# Patient Record
Sex: Female | Born: 2000
Health system: Southern US, Community
[De-identification: ages and names within clinical notes are randomized; demographics above are authoritative.]

## PROBLEM LIST (undated history)

## (undated) DIAGNOSIS — J45909 Unspecified asthma, uncomplicated: Secondary | ICD-10-CM

## (undated) DIAGNOSIS — F32A Depression, unspecified: Secondary | ICD-10-CM

## (undated) DIAGNOSIS — F909 Attention-deficit hyperactivity disorder, unspecified type: Secondary | ICD-10-CM

## (undated) DIAGNOSIS — R7303 Prediabetes: Secondary | ICD-10-CM

## (undated) DIAGNOSIS — F419 Anxiety disorder, unspecified: Secondary | ICD-10-CM

## (undated) DIAGNOSIS — E559 Vitamin D deficiency, unspecified: Secondary | ICD-10-CM

## (undated) DIAGNOSIS — F988 Other specified behavioral and emotional disorders with onset usually occurring in childhood and adolescence: Secondary | ICD-10-CM

## (undated) HISTORY — DX: Depression, unspecified: F32.A

## (undated) HISTORY — DX: Vitamin D deficiency, unspecified: E55.9

## (undated) HISTORY — DX: Prediabetes: R73.03

## (undated) HISTORY — PX: TONSILLECTOMY: SUR1361

## (undated) HISTORY — DX: Other specified behavioral and emotional disorders with onset usually occurring in childhood and adolescence: F98.8

## (undated) HISTORY — DX: Anxiety disorder, unspecified: F41.9

---
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
## 2019-01-08 ENCOUNTER — Emergency Department (HOSPITAL_COMMUNITY)
Admission: EM | Admit: 2019-01-08 | Discharge: 2019-01-08 | Disposition: A | Discharge status: Home / Self Care | Payer: 59 | Attending: Emergency Medicine | Admitting: Emergency Medicine

## 2019-01-08 ENCOUNTER — Other Ambulatory Visit: Payer: Self-pay

## 2019-01-08 ENCOUNTER — Encounter (HOSPITAL_COMMUNITY): Payer: Self-pay | Admitting: Emergency Medicine

## 2019-01-08 ENCOUNTER — Emergency Department (HOSPITAL_COMMUNITY): Payer: 59

## 2019-01-08 DIAGNOSIS — R0602 Shortness of breath: Secondary | ICD-10-CM | POA: Diagnosis present

## 2019-01-08 DIAGNOSIS — R062 Wheezing: Secondary | ICD-10-CM | POA: Diagnosis not present

## 2019-01-08 DIAGNOSIS — Z79899 Other long term (current) drug therapy: Secondary | ICD-10-CM | POA: Diagnosis not present

## 2019-01-08 DIAGNOSIS — R05 Cough: Secondary | ICD-10-CM | POA: Diagnosis not present

## 2019-01-08 DIAGNOSIS — J45909 Unspecified asthma, uncomplicated: Secondary | ICD-10-CM | POA: Insufficient documentation

## 2019-01-08 DIAGNOSIS — J069 Acute upper respiratory infection, unspecified: Secondary | ICD-10-CM | POA: Diagnosis not present

## 2019-01-08 DIAGNOSIS — B9789 Other viral agents as the cause of diseases classified elsewhere: Secondary | ICD-10-CM | POA: Diagnosis not present

## 2019-01-08 LAB — GROUP A STREP BY PCR: Group A Strep by PCR: NOT DETECTED — AB

## 2019-01-08 MED ORDER — IBUPROFEN 600 MG PO TABS: 600.0000 mg | ORAL_TABLET | Freq: Four times a day (QID) | ORAL | 0 refills | Status: DC | PRN

## 2019-01-08 MED ORDER — KETOROLAC TROMETHAMINE 30 MG/ML IJ SOLN
30.0000 mg | Freq: Once | INTRAMUSCULAR | Status: DC
  Filled 2019-01-08: qty 1

## 2019-01-08 MED ORDER — ALBUTEROL SULFATE HFA 108 (90 BASE) MCG/ACT IN AERS
1.0000 | INHALATION_SPRAY | Freq: Once | RESPIRATORY_TRACT | Status: AC
  Administered 2019-01-08: 2 via RESPIRATORY_TRACT
  Filled 2019-01-08: qty 6.7

## 2019-01-08 MED ORDER — IPRATROPIUM-ALBUTEROL 0.5-2.5 (3) MG/3ML IN SOLN
3.0000 mL | Freq: Once | RESPIRATORY_TRACT | Status: DC
  Filled 2019-01-08: qty 3

## 2019-01-08 MED ORDER — AEROCHAMBER PLUS FLO-VU LARGE MISC
1.0000 | Freq: Once | Status: AC
  Administered 2019-01-08: 1

## 2019-01-08 MED ORDER — ALBUTEROL SULFATE (2.5 MG/3ML) 0.083% IN NEBU
5.0000 mg | INHALATION_SOLUTION | Freq: Once | RESPIRATORY_TRACT | Status: AC
  Administered 2019-01-08: 5 mg via RESPIRATORY_TRACT
  Filled 2019-01-08: qty 6

## 2019-01-08 MED FILL — IBUPROFEN 600 MG TABLET: 600 | 7 days supply | Qty: 30 | Fill #0

## 2019-01-08 NOTE — ED Provider Notes (Signed)
[facility] EMERGENCY DEPARTMENT Provider Note   CSN: [id number] Arrival date & time: 01/08/19  0108     History   Chief Complaint Chief Complaint  Patient presents with  . Cough  . Nasal Congestion  . Shortness of Breath    HPI Michelle Blevins is a 18 y.o. female.  Pt presents to the ED today with sob, nasal congestion, sore throat, and cough.  Pt has been sick since 1/23.  She has been exposed to family members and school friends who have been sick.  Pt has a hx of asthma and does not have an inhaler.  She developed wheezing and had a hard time breathing. This prompted her to come to the ED.  No fevers.     Past Medical History:  Diagnosis Date  . ADHD (attention deficit hyperactivity disorder)   . Asthma     Patient Active Problem List   Diagnosis Date Noted  . ADHD (attention deficit hyperactivity disorder), 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  . 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 TONSILLECTOMY       OB History   No obstetric history on file.      Home Medications    Prior to Admission medications   Medication Sig Start Date End Date Taking? Authorizing Provider  Multiple Vitamin (MULTIVITAMIN WITH MINERALS) TABS tablet Take 1 tablet by mouth daily.   Yes [provider]  VYVANSE 40 MG capsule Take 1 capsule (40 mg total) by mouth every morning. Patient taking differently: Take 40 mg by mouth daily. Take with Vyvanse 50 mg to equal 90 mg 11/08/18  Yes Paretta-Leahey, Dawn M, NP  VYVANSE 50 MG capsule Take 1 capsule (50 mg total) by mouth every morning. Patient taking differently: Take 50 mg by mouth daily. Take with vyvanse 40 mg to equal 90 mg 11/08/18  Yes Paretta-Leahey, Miachel Roux, NP  ibuprofen (ADVIL,MOTRIN) 600 MG tablet Take 1 tablet (600 mg total) by mouth every 6 (six) hours as needed. 01/08/19   Jacalyn Lefevre, MD    Family History Family History  Problem Relation Age of Onset  . Breast  cancer Mother     Social History Social History   Tobacco Use  . Smoking status: Never Smoker  . Smokeless tobacco: Never Used  Substance Use Topics  . Alcohol use: No  . Drug use: No     Allergies   Epiduo [adapalene-benzoyl peroxide]   Review of Systems Review of Systems  HENT: Positive for congestion, rhinorrhea and sore throat.   Respiratory: Positive for cough and wheezing.   All other systems reviewed and are negative.    Physical Exam Updated Vital Signs BP 104/65 (BP Location: Right Arm)   Pulse 67   Temp 98 F (36.7 C) (Oral)   Resp 18   LMP 12/30/2018   SpO2 98%   Physical Exam Vitals signs and nursing note reviewed.  Constitutional:      Appearance: She is well-developed and normal weight.  HENT:     Head: Normocephalic and atraumatic.     Mouth/Throat:     Mouth: Mucous membranes are moist.     Pharynx: Pharyngeal swelling and oropharyngeal exudate present.  Eyes:     Extraocular Movements: Extraocular movements intact.     Pupils: Pupils are equal, round, and reactive to light.  Neck:     Musculoskeletal: Normal range of motion and neck supple.  Cardiovascular:     Rate and Rhythm:  Normal rate and regular rhythm.  Pulmonary:     Effort: Pulmonary effort is normal.     Breath sounds: Wheezing present.  Abdominal:     General: Bowel sounds are normal.     Palpations: Abdomen is soft.  Musculoskeletal: Normal range of motion.  Skin:    General: Skin is warm and dry.     Capillary Refill: Capillary refill takes less than 2 seconds.  Neurological:     General: No focal deficit present.     Mental Status: She is alert and oriented to person, place, and time.  Psychiatric:        Mood and Affect: Mood normal.        Behavior: Behavior normal.      ED Treatments / Results  Labs (all labs ordered are listed, but only abnormal results are displayed) Labs Reviewed  GROUP A STREP BY PCR - Abnormal; Notable for the following components:       Result Value   Group A Strep by PCR NONE DETECTED (*)    All other components within normal limits    EKG None  Radiology Dg Chest 2 View  Result Date: 01/08/2019 CLINICAL DATA:  Cough and congestion for 2 days EXAM: CHEST - 2 VIEW COMPARISON:  None. FINDINGS: Prominent markings at the lung bases on the frontal view but no focal airspace disease on the lateral view. There is no edema, consolidation, effusion, or pneumothorax. Normal heart size and mediastinal contours. IMPRESSION: Negative chest. Electronically Signed   By: Marnee Spring M.D.   On: 01/08/2019 04:37    Procedures Procedures (including critical care time)  Medications Ordered in ED Medications  ketorolac (TORADOL) 30 MG/ML injection 30 mg (has no administration in time range)  AEROCHAMBER PLUS FLO-VU LARGE MISC 1 each (has no administration in time range)  albuterol (PROVENTIL) (2.5 MG/3ML) 0.083% nebulizer solution 5 mg (5 mg Nebulization Given 01/08/19 0118)  albuterol (PROVENTIL HFA;VENTOLIN HFA) 108 (90 Base) MCG/ACT inhaler 1-2 puff (2 puffs Inhalation Given 01/08/19 0543)     Initial Impression / Assessment and Plan / ED Course  I have reviewed the triage vital signs and the nursing notes.  Pertinent labs & imaging results that were available during my care of the patient were reviewed by me and considered in my medical decision making (see chart for details).     Strep negative.  She likely has the flu, but she's been sick for too many days and she is not a high risk patient.  Pt given an albuterol inhaler plus spacer.  She is instructed to return if worse.  F/u with pcp.  Final Clinical Impressions(s) / ED Diagnoses   Final diagnoses:  Viral upper respiratory tract infection    ED Discharge Orders         Ordered    ibuprofen (ADVIL,MOTRIN) 600 MG tablet  Every 6 hours PRN     01/08/19 4332           Jacalyn Lefevre, MD 01/08/19 [phone]

## 2019-01-08 NOTE — ED Triage Notes (Addendum)
Reports sore throat since Thursday, productive cough with white sputum, and nasal congestion.  Denies body aches and fever.  Also reports SOB that started tonight.  Audible wheezing.

## 2019-01-10 ENCOUNTER — Ambulatory Visit: Payer: 59 | Admitting: Family Medicine

## 2019-01-10 ENCOUNTER — Encounter: Payer: Self-pay | Admitting: Family Medicine

## 2019-01-10 VITALS — BP 110/82 | HR 118 | Temp 98.4°F | Wt 212.0 lb

## 2019-01-10 DIAGNOSIS — J4531 Mild persistent asthma with (acute) exacerbation: Secondary | ICD-10-CM

## 2019-01-10 DIAGNOSIS — B9789 Other viral agents as the cause of diseases classified elsewhere: Secondary | ICD-10-CM

## 2019-01-10 DIAGNOSIS — J069 Acute upper respiratory infection, unspecified: Secondary | ICD-10-CM

## 2019-01-10 NOTE — Progress Notes (Signed)
Subjective:    Patient ID: Michelle Blevins, female    DOB: Feb 11, 2001, 18 y.o.   MRN: [id number]  No chief complaint on file.   HPI Patient was seen today for ED follow-up.  Pt seen on1/27/2020 for viral upper respiratory infection told likely influenza but not tested for as >48hrs with symptoms.  Pt tested for Strep, which was negative.   Pt here today for f/u.  Endorses scratchy throat, rhinorrhea, nasal congestion, coughing, decreased hearing.  Pt denies fever, chills, HA, N/V.  Sick contacts included her classmates.  Pt has taken cough medicine and albuterol inhaler.  Pt unsure how often she is using the inhaler.  Has a h/o asthma (though never mentioned before). Notes increased symptoms with being sick.  Past Medical History:  Diagnosis Date  . ADHD (attention deficit hyperactivity disorder)   . Asthma     Allergies  Allergen Reactions  . Epiduo [Adapalene-Benzoyl Peroxide] Swelling    Pt had swelling in the face from medication    ROS General: Denies fever, chills, night sweats, changes in weight, changes in appetite HEENT: Denies headaches, ear pain, changes in vision, rhinorrhea, sore throat  +decreased hearing, rhinorrhea, scratchy throat, nasal congestion CV: Denies CP, palpitations, SOB, orthopnea Pulm: Denies SOB  +cough, wheezing GI: Denies abdominal pain, nausea, vomiting, diarrhea, constipation GU: Denies dysuria, hematuria, frequency, vaginal discharge Msk: Denies muscle cramps, joint pains Neuro: Denies weakness, numbness, tingling Skin: Denies rashes, bruising Psych: Denies depression, anxiety, hallucinations    Objective:    Blood pressure 110/82, pulse (!) 118, temperature 98.4 F (36.9 C), temperature source Oral, weight 212 lb (96.2 kg), last menstrual period 12/30/2018, SpO2 96 %.  Gen. Pleasant, well-nourished, in no distress, normal affect   HEENT: [place]/AT, face symmetric, no scleral icterus, PERRLA, nares patent with clear drainage, pharynx with mild  erythema and post nasal drainage, no exudate.  TMs full.  No cervical lymphadenopathy Lungs: coughing, no accessory muscle use, diffuse wheezing throughout Cardiovascular: tachycardia, no m/r/g, no peripheral edema Neuro:  A&Ox3, CN II-XII intact, normal gait  Wt Readings from Last 3 Encounters:  01/10/19 212 lb (96.2 kg) (98 %, Z= 2.12)*  10/05/17 202 lb 6.4 oz (91.8 kg) (98 %, Z= 2.05)*  10/19/15 188 lb (85.3 kg) (98 %, Z= 2.03)*   * Growth percentiles are based on CDC (Girls, 2-20 Years) data.    Lab Results  Component Value Date   WBC 9.7 02/27/2009   HGB 12.1 02/27/2009   HCT 35.9 02/27/2009   PLT 419 (H) 02/27/2009   INR 1.0 02/27/2009    Assessment/Plan:  Viral upper respiratory tract infection with cough -discussed supportive treatment -encouraged to stay hydrated. -given handout  Mild persistent asthma with acute exacerbation -Duo neb given in clinic 2/2 wheezing -continue albuterol inhaler q 6hrs prn -Given RTC or ED precautions  F/u prn  Abbe Amsterdam, MD

## 2019-01-10 NOTE — Patient Instructions (Signed)
Upper Respiratory Infection, Adult An upper respiratory infection (URI) is a common viral infection of the nose, throat, and upper air passages that lead to the lungs. The most common type of URI is the common cold. URIs usually get better on their own, without medical treatment. What are the causes? A URI is caused by a virus. You may catch a virus by:  Breathing in droplets from an infected person's cough or sneeze.  Touching something that has been exposed to the virus (contaminated) and then touching your mouth, nose, or eyes. What increases the risk? You are more likely to get a URI if:  You are very young or very old.  It is autumn or winter.  You have close contact with others, such as at a daycare, school, or health care facility.  You smoke.  You have long-term (chronic) heart or lung disease.  You have a weakened disease-fighting (immune) system.  You have nasal allergies or asthma.  You are experiencing a lot of stress.  You work in an area that has poor air circulation.  You have poor nutrition. What are the signs or symptoms? A URI usually involves some of the following symptoms:  Runny or stuffy (congested) nose.  Sneezing.  Cough.  Sore throat.  Headache.  Fatigue.  Fever.  Loss of appetite.  Pain in your forehead, behind your eyes, and over your cheekbones (sinus pain).  Muscle aches.  Redness or irritation of the eyes.  Pressure in the ears or face. How is this diagnosed? This condition may be diagnosed based on your medical history and symptoms, and a physical exam. Your health care provider may use a cotton swab to take a mucus sample from your nose (nasal swab). This sample can be tested to determine what virus is causing the illness. How is this treated? URIs usually get better on their own within 7-10 days. You can take steps at home to relieve your symptoms. Medicines cannot cure URIs, but your health care provider may recommend  certain medicines to help relieve symptoms, such as:  Over-the-counter cold medicines.  Cough suppressants. Coughing is a type of defense against infection that helps to clear the respiratory system, so take these medicines only as recommended by your health care provider.  Fever-reducing medicines. Follow these instructions at home: Activity  Rest as needed.  If you have a fever, stay home from work or school until your fever is gone or until your health care provider says you are no longer contagious. Your health care provider may have you wear a face mask to prevent your infection from spreading. Relieving symptoms  Gargle with a salt-water mixture 3-4 times a day or as needed. To make a salt-water mixture, completely dissolve -1 tsp of salt in 1 cup of warm water.  Use a cool-mist humidifier to add moisture to the air. This can help you breathe more easily. Eating and drinking   Drink enough fluid to keep your urine pale yellow.  Eat soups and other clear broths. General instructions   Take over-the-counter and prescription medicines only as told by your health care provider. These include cold medicines, fever reducers, and cough suppressants.  Do not use any products that contain nicotine or tobacco, such as cigarettes and e-cigarettes. If you need help quitting, ask your health care provider.  Stay away from secondhand smoke.  Stay up to date on all immunizations, including the yearly (annual) flu vaccine.  Keep all follow-up visits as told by your health   care provider. This is important. How to prevent the spread of infection to others   URIs can be passed from person to person (are contagious). To prevent the infection from spreading: ? Wash your hands often with soap and water. If soap and water are not available, use hand sanitizer. ? Avoid touching your mouth, face, eyes, or nose. ? Cough or sneeze into a tissue or your sleeve or elbow instead of into your hand  or into the air. Contact a health care provider if:  You are getting worse instead of better.  You have a fever or chills.  Your mucus is brown or red.  You have yellow or brown discharge coming from your nose.  You have pain in your face, especially when you bend forward.  You have swollen neck glands.  You have pain while swallowing.  You have white areas in the back of your throat. Get help right away if:  You have shortness of breath that gets worse.  You have severe or persistent: ? Headache. ? Ear pain. ? Sinus pain. ? Chest pain.  You have chronic lung disease along with any of the following: ? Wheezing. ? Prolonged cough. ? Coughing up blood. ? A change in your usual mucus.  You have a stiff neck.  You have changes in your: ? Vision. ? Hearing. ? Thinking. ? Mood. Summary  An upper respiratory infection (URI) is a common infection of the nose, throat, and upper air passages that lead to the lungs.  A URI is caused by a virus.  URIs usually get better on their own within 7-10 days.  Medicines cannot cure URIs, but your health care provider may recommend certain medicines to help relieve symptoms. This information is not intended to replace advice given to you by your health care provider. Make sure you discuss any questions you have with your health care provider. Document Released: 05/25/2001 Document Revised: 07/15/2017 Document Reviewed: 07/15/2017 Elsevier Interactive Patient Education  2019 [employer] Inc[employer]    

## 2019-01-12 ENCOUNTER — Encounter (HOSPITAL_COMMUNITY): Payer: Self-pay | Admitting: Emergency Medicine

## 2019-01-12 ENCOUNTER — Ambulatory Visit (HOSPITAL_COMMUNITY)
Admission: EM | Admit: 2019-01-12 | Discharge: 2019-01-12 | Disposition: A | Discharge status: Home / Self Care | Payer: 59 | Attending: Family Medicine | Admitting: Family Medicine

## 2019-01-12 ENCOUNTER — Other Ambulatory Visit: Payer: Self-pay

## 2019-01-12 DIAGNOSIS — J069 Acute upper respiratory infection, unspecified: Secondary | ICD-10-CM

## 2019-01-12 DIAGNOSIS — G510 Bell's palsy: Secondary | ICD-10-CM

## 2019-01-12 MED ORDER — POLYMYXIN B-TRIMETHOPRIM 10000-0.1 UNIT/ML-% OP SOLN: 1.0000 [drp] | OPHTHALMIC | 0 refills | Status: DC

## 2019-01-12 MED ORDER — VALACYCLOVIR HCL 1 G PO TABS: 1000.0000 mg | ORAL_TABLET | Freq: Three times a day (TID) | ORAL | 0 refills | Status: DC

## 2019-01-12 MED ORDER — PREDNISONE 10 MG (21) PO TBPK: ORAL_TABLET | ORAL | 0 refills | Status: DC

## 2019-01-12 NOTE — Discharge Instructions (Signed)
We are treating for Bell's palsy today Valacyclovir 3 times a day for the next 7 days Prednisone taper over the next 6 days Polytrim eyedrops for the left eye.  1 drop in the left eye every 4 hours while awake Start taking some Mucinex for cough, congestion and to thin mucus Ibuprofen as needed for pain Follow up as needed for continued or worsening symptoms

## 2019-01-12 NOTE — ED Triage Notes (Signed)
PT reports left sided facial numbness and droop that started yesterday. PT's smile is assymetrical. Watery eye on left side. PT reports no numbness or loss of sensation in left arm or leg. Has had a cold recently.

## 2019-01-13 ENCOUNTER — Encounter: Payer: Self-pay | Admitting: Family Medicine

## 2019-01-13 ENCOUNTER — Ambulatory Visit: Payer: 59 | Admitting: Adult Health

## 2019-01-13 DIAGNOSIS — J4531 Mild persistent asthma with (acute) exacerbation: Secondary | ICD-10-CM | POA: Insufficient documentation

## 2019-01-14 NOTE — ED Provider Notes (Signed)
[facility]    CSN: [id number] Arrival date & time: 01/12/19  1854     History   Chief Complaint Chief Complaint  Patient presents with  . Facial Droop    HPI Michelle Blevins is a 18 y.o. female.   Pt is a 18 year old female that presents with URI symptoms, left facial problem. She noticed the facial paralysis on Thursday. She is having facial droop, trouble closing the left eye and tearing from the left eye. Left side of face "feels funny". She was seen in the ER on 01/08/19 and dx with URI and given duo neb and sent home with inhaler for wheezing. Her URI symptoms have worsened. Her cough and congestion is worse. No fever, myalgias, sore throat or ear pain. Mom has been giving her mucinex. No vision changes, headache, dizziness.   ROS per HPI      Past Medical History:  Diagnosis Date  . ADHD (attention deficit hyperactivity disorder)   . Asthma     Patient Active Problem List   Diagnosis Date Noted  . Mild persistent asthma with acute exacerbation 01/13/2019  . ADHD (attention deficit hyperactivity disorder), 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  . 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 TONSILLECTOMY      OB History   No obstetric history on file.      Home Medications    Prior to Admission medications   Medication Sig Start Date End Date Taking? Authorizing Provider  Multiple Vitamin (MULTIVITAMIN WITH MINERALS) TABS tablet Take 1 tablet by mouth daily.   Yes [provider]  VYVANSE 40 MG capsule Take 1 capsule (40 mg total) by mouth every morning. Patient taking differently: Take 40 mg by mouth daily. Take with Vyvanse 50 mg to equal 90 mg 11/08/18  Yes Paretta-Leahey, Dawn M, NP  VYVANSE 50 MG capsule Take 1 capsule (50 mg total) by mouth every morning. Patient taking differently: Take 50 mg by mouth daily. Take with vyvanse 40 mg to equal 90 mg 11/08/18  Yes Paretta-Leahey, Miachel Roux, NP  ibuprofen  (ADVIL,MOTRIN) 600 MG tablet Take 1 tablet (600 mg total) by mouth every 6 (six) hours as needed. 01/08/19   Jacalyn Lefevre, MD  predniSONE (STERAPRED UNI-PAK 21 TAB) 10 MG (21) TBPK tablet 6 tabs for 1 day, then 5 tabs for 1 das, then 4 tabs for 1 day, then 3 tabs for 1 day, 2 tabs for 1 day, then 1 tab for 1 day 01/12/19   Dahlia Byes A, NP  trimethoprim-polymyxin b (POLYTRIM) ophthalmic solution Place 1 drop into the left eye every 4 (four) hours. 01/12/19   Dahlia Byes A, NP  valACYclovir (VALTREX) 1000 MG tablet Take 1 tablet (1,000 mg total) by mouth 3 (three) times daily. 01/12/19   Janace Aris, NP    Family History Family History  Problem Relation Age of Onset  . Breast cancer Mother     Social History Social History   Tobacco Use  . Smoking status: Never Smoker  . Smokeless tobacco: Never Used  Substance Use Topics  . Alcohol use: No  . Drug use: No     Allergies   Epiduo [adapalene-benzoyl peroxide]   Review of Systems Review of Systems   Physical Exam Triage Vital Signs ED Triage Vitals  Enc Vitals Group     BP 01/12/19 1920 118/72     Pulse Rate 01/12/19 1920 (!) 112     Resp 01/12/19 1920  16     Temp 01/12/19 1920 98.5 F (36.9 C)     Temp Source 01/12/19 1920 Oral     SpO2 01/12/19 1920 100 %     Weight --      Height --      Head Circumference --      Peak Flow --      Pain Score 01/12/19 1919 0     Pain Loc --      Pain Edu? --      Excl. in GC? --    No data found.  Updated Vital Signs BP 118/72   Pulse (!) 112   Temp 98.5 F (36.9 C) (Oral)   Resp 16   LMP 12/30/2018   SpO2 100%   Visual Acuity Right Eye Distance: 20/20 Left Eye Distance: 20/40 Bilateral Distance: 20/20  Right Eye Near:   Left Eye Near:    Bilateral Near:     Physical Exam Vitals signs and nursing note reviewed.  Constitutional:      General: She is not in acute distress.    Appearance: She is well-developed. She is ill-appearing. She is not toxic-appearing.   HENT:     Head: Normocephalic and atraumatic.     Right Ear: Tympanic membrane and ear canal normal.     Left Ear: Tympanic membrane and ear canal normal.     Nose: Congestion and rhinorrhea present.     Mouth/Throat:     Pharynx: Oropharynx is clear.  Eyes:     General:        Left eye: Discharge present.    Conjunctiva/sclera: Conjunctivae normal.     Comments: Left scleral injection with mucous.   Neck:     Musculoskeletal: Neck supple. No neck rigidity.  Cardiovascular:     Rate and Rhythm: Normal rate and regular rhythm.     Heart sounds: No murmur.  Pulmonary:     Effort: Pulmonary effort is normal. No respiratory distress.     Breath sounds: Wheezing present.     Comments: Harsh cough Musculoskeletal: Normal range of motion.  Skin:    General: Skin is warm and dry.     Findings: No rash.  Neurological:     Mental Status: She is alert.     Cranial Nerves: Cranial nerve deficit present.     Comments: Left facial paralysis.  Unable to raise the left eyebrow and positive left facial droop with smiling.  She is able to close the left eye with force.  Mild sensory deficit to the left facial area.   No other deficits.   Psychiatric:        Mood and Affect: Mood normal.      UC Treatments / Results  Labs (all labs ordered are listed, but only abnormal results are displayed) Labs Reviewed - No data to display  EKG None  Radiology No results found.  Procedures Procedures (including critical care time)  Medications Ordered in UC Medications - No data to display  Initial Impression / Assessment and Plan / UC Course  I have reviewed the triage vital signs and the nursing notes.  Pertinent labs & imaging results that were available during my care of the patient were reviewed by me and considered in my medical decision making (see chart for details).     Symptoms consistent with Bells Palsy I believe that she also has severe upper  respiratory infection with  viral conjunctivitis.  Some wheezing present and harsh cough.  will go ahead and treat with valacyclovir and prednisone Giving Polytrim for the left eye.  mucinex for cough and congestion albuterol for cough, wheezing SOB.  Follow up as needed for continued or worsening symptoms      Final Clinical Impressions(s) / UC Diagnoses   Final diagnoses:  Bell's palsy  Acute upper respiratory infection     Discharge Instructions     We are treating for Bell's palsy today Valacyclovir 3 times a day for the next 7 days Prednisone taper over the next 6 days Polytrim eyedrops for the left eye.  1 drop in the left eye every 4 hours while awake Start taking some Mucinex for cough, congestion and to thin mucus Ibuprofen as needed for pain Follow up as needed for continued or worsening symptoms      ED Prescriptions    Medication Sig Dispense Auth. Provider   valACYclovir (VALTREX) 1000 MG tablet Take 1 tablet (1,000 mg total) by mouth 3 (three) times daily. 21 tablet Krishav Mamone A, NP   predniSONE (STERAPRED UNI-PAK 21 TAB) 10 MG (21) TBPK tablet 6 tabs for 1 day, then 5 tabs for 1 das, then 4 tabs for 1 day, then 3 tabs for 1 day, 2 tabs for 1 day, then 1 tab for 1 day 21 tablet Neta Upadhyay A, NP   trimethoprim-polymyxin b (POLYTRIM) ophthalmic solution Place 1 drop into the left eye every 4 (four) hours. 10 mL Dahlia ByesBast, Lyndol Vanderheiden A, NP     Controlled Substance Prescriptions [place] Controlled Substance Registry consulted? no   Janace ArisBast, Aliviana Burdell A, NP 01/14/19 1351

## 2019-03-16 ENCOUNTER — Other Ambulatory Visit: Payer: Self-pay

## 2019-03-16 ENCOUNTER — Encounter: Payer: Self-pay | Admitting: Family

## 2019-03-16 ENCOUNTER — Ambulatory Visit (INDEPENDENT_AMBULATORY_CARE_PROVIDER_SITE_OTHER): Payer: 59 | Admitting: Family

## 2019-03-16 DIAGNOSIS — F902 Attention-deficit hyperactivity disorder, combined type: Secondary | ICD-10-CM | POA: Diagnosis not present

## 2019-03-16 DIAGNOSIS — Z7189 Other specified counseling: Secondary | ICD-10-CM | POA: Diagnosis not present

## 2019-03-16 DIAGNOSIS — R278 Other lack of coordination: Secondary | ICD-10-CM

## 2019-03-16 DIAGNOSIS — F819 Developmental disorder of scholastic skills, unspecified: Secondary | ICD-10-CM | POA: Diagnosis not present

## 2019-03-16 DIAGNOSIS — Z79899 Other long term (current) drug therapy: Secondary | ICD-10-CM

## 2019-03-16 DIAGNOSIS — Z719 Counseling, unspecified: Secondary | ICD-10-CM

## 2019-03-16 DIAGNOSIS — Z8709 Personal history of other diseases of the respiratory system: Secondary | ICD-10-CM | POA: Diagnosis not present

## 2019-03-16 MED ORDER — VYVANSE 50 MG PO CAPS: 50.0000 mg | ORAL_CAPSULE | Freq: Every day | ORAL | 0 refills | Status: DC

## 2019-03-16 MED ORDER — VYVANSE 40 MG PO CAPS: 40.0000 mg | ORAL_CAPSULE | ORAL | 0 refills | Status: DC

## 2019-03-16 MED FILL — VYVANSE 50 MG CAPSULE: 50 | 30 days supply | Qty: 30 | Fill #0

## 2019-03-16 MED FILL — VYVANSE 40 MG CAPSULE: 40 | 30 days supply | Qty: 30 | Fill #0

## 2019-03-16 NOTE — Progress Notes (Signed)
Patient ID: Michelle Blevins, female   DOB: 2001/01/21, 18 y.o.   MRN: [id number]  [facility] [address], [place]. [address] [place] [address] Dept: [phone] Dept Fax: [phone]  Medication Check visit via Virtual Video due to COVID-19  Patient ID:  Michelle Blevins  female DOB: [birth year]   18 y.o.   MRN: [id number]   DATE:03/16/19  PCP: Deeann Saint, MD  Virtual Visit via Video Note  I connected [phone] 's self (Name Michelle Blevins) on 03/16/19 at  2:00 PM EDT by a video enabled telemedicine application and verified that I am speaking with the correct person using two identifiers.   I discussed the limitations of evaluation and management by telemedicine and the availability of in person appointments. The patient/parent expressed understanding and agreed to proceed.  Parent Location: at home  Provider Locations: [address] [place], [place]  HISTORY/CURRENT STATUS: Michelle Blevins is here for medication management of the psychoactive medications for ADHD and review of educational and behavioral concerns.  Michelle Blevins currently taking Vyvanse 50 mg and 40 mg each daily, which is working well. Takes medication at 8:00 am. Medication tends to wear off around 5:00 pm. Michelle Blevins is able to focus through school work.   Michelle Blevins is eating well (eating breakfast, lunch and dinner).  No recent changes reported by patient, the same.  Sleeping well (goes to bed at 11:00 pm wakes at 7:00 am), sleeping through the night. No changes recently.  Michelle Blevins denies thoughts of hurting self or others, denies depression, anxiety, or fears.   EDUCATION: School: [employer] Year/Grade: 12th grade  Performance/ Grades: above average Services: IEP/504 Plan Work to complete outside of the class meeting time and it takes only 30 mins or less.   To attend [employer] next year and looking at orientation online instead of in  person. To attend summer program is still is allowed by the school.   Michelle Blevins is currently out of school due to social distancing due to COVID-19 Attending online classes daily and not sure what school will do for graduation. Online graduation for schooling if continues with COVID-19 restrictions.   Activities/ Exercise: intermittently-walking the dog and exercise on the piliates machine  Screen time: (phone, tablet, TV, computer): No changes with the amount of time she is on a screen. Enjoying more outside time and drawing.   MEDICAL HISTORY: Individual Medical History/ Review of Systems: Changes? :Yes URI in January and seen in the ED, repeated ED visit for Bell's Palsy.   Family Medical/ Social History: Changes? None   Current Medications:  Outpatient Encounter Medications as of 03/16/2019  Medication Sig  . loratadine (CLARITIN) 10 MG tablet Take 10 mg by mouth daily.  . Multiple Vitamin (MULTIVITAMIN WITH MINERALS) TABS tablet Take 1 tablet by mouth daily.  Marland Kitchen VYVANSE 40 MG capsule Take 1 capsule (40 mg total) by mouth every morning.  Marland Kitchen VYVANSE 50 MG capsule Take 1 capsule (50 mg total) by mouth daily. Take with vyvanse 40 mg to equal 90 mg  . [DISCONTINUED] VYVANSE 40 MG capsule Take 1 capsule (40 mg total) by mouth every morning.  . [DISCONTINUED] VYVANSE 50 MG capsule Take 1 capsule (50 mg total) by mouth every morning. (Patient taking differently: Take 50 mg by mouth daily. Take with vyvanse 40 mg to equal 90 mg)  . ibuprofen (ADVIL,MOTRIN) 600 MG tablet Take 1 tablet (600 mg total) by mouth every 6 (six) hours as  needed. (Patient not taking: Reported on 03/16/2019)  . [DISCONTINUED] predniSONE (STERAPRED UNI-PAK 21 TAB) 10 MG (21) TBPK tablet 6 tabs for 1 day, then 5 tabs for 1 das, then 4 tabs for 1 day, then 3 tabs for 1 day, 2 tabs for 1 day, then 1 tab for 1 day (Patient not taking: Reported on 03/16/2019)  . [DISCONTINUED] trimethoprim-polymyxin b (POLYTRIM) ophthalmic solution Place 1  drop into the left eye every 4 (four) hours. (Patient not taking: Reported on 03/16/2019)  . [DISCONTINUED] valACYclovir (VALTREX) 1000 MG tablet Take 1 tablet (1,000 mg total) by mouth 3 (three) times daily. (Patient not taking: Reported on 03/16/2019)   No facility-administered encounter medications on file as of 03/16/2019.    Medication Side Effects: None  MENTAL HEALTH: Mental Health Issues:   None reported by patient today.   DIAGNOSES:    ICD-10-CM   1. ADHD (attention deficit hyperactivity disorder), combined type F90.2 VYVANSE 40 MG capsule    VYVANSE 50 MG capsule  2. Dysgraphia R27.8   3. Medication management Z79.899   4. Learning difficulty F81.9   5. Patient counseled Z71.9   6. Goals of care, counseling/discussion Z71.89   7. History of asthma Z87.09     RECOMMENDATIONS:  Discussed recent history and updates received by patient since last f/u visit.  Discussed school academic progress and appropriate accommodations as needed for learning problems school.   Discussed continued need for routine, structure, motivation, reward and positive reinforcement with continued learning success.   Encouraged recommended limitations on TV, tablets, phones, video games and computers for non-educational activities.   Encouraged physical activity and outdoor play, maintaining social distancing.   Referred to [employer] for resources about engaging children who are at home in home and online study.    Counseled medication pharmacokinetics, options, dosage, administration, desired effects, and possible side effects.   Vyvanse 50 mg and 40 mg each Rx # 30 with no RF's.   I discussed the assessment and treatment plan with the patient/parent. The patient/parent was provided an opportunity to ask questions and all were answered. The patient/ parent agreed with the plan and demonstrated an understanding of the instructions.   I provided 30 minutes of non-face-to-face time during this  encounter. Record review of 10 minutes prior to the virtual video visit.   NEXT APPOINTMENT:  Return in about 3 months (around 06/15/2019) for follow up visit.  The patient/parent was advised to call back or seek an in-person evaluation if the symptoms worsen or if the condition fails to improve as anticipated.  Medical Decision-making: More than 50% of the appointment was spent counseling and discussing diagnosis and management of symptoms with the patient and family.  Carron Curie, NP

## 2019-05-28 ENCOUNTER — Ambulatory Visit (INDEPENDENT_AMBULATORY_CARE_PROVIDER_SITE_OTHER): Payer: 59 | Admitting: Family

## 2019-05-28 ENCOUNTER — Encounter: Payer: Self-pay | Admitting: Family

## 2019-05-28 DIAGNOSIS — F819 Developmental disorder of scholastic skills, unspecified: Secondary | ICD-10-CM

## 2019-05-28 DIAGNOSIS — F902 Attention-deficit hyperactivity disorder, combined type: Secondary | ICD-10-CM | POA: Diagnosis not present

## 2019-05-28 DIAGNOSIS — R278 Other lack of coordination: Secondary | ICD-10-CM

## 2019-05-28 DIAGNOSIS — J4531 Mild persistent asthma with (acute) exacerbation: Secondary | ICD-10-CM

## 2019-05-28 DIAGNOSIS — Z79899 Other long term (current) drug therapy: Secondary | ICD-10-CM

## 2019-05-28 MED ORDER — VYVANSE 50 MG PO CAPS: 50.0000 mg | ORAL_CAPSULE | Freq: Every day | ORAL | 0 refills | Status: DC

## 2019-05-28 MED ORDER — VYVANSE 40 MG PO CAPS: 40.0000 mg | ORAL_CAPSULE | ORAL | 0 refills | Status: DC

## 2019-05-28 MED FILL — VYVANSE 50 MG CAPSULE: 50 | 30 days supply | Qty: 30 | Fill #0

## 2019-05-28 MED FILL — VYVANSE 40 MG CAPSULE: 40 | 30 days supply | Qty: 30 | Fill #0

## 2019-05-28 NOTE — Progress Notes (Signed)
[facility] [address] [place] [address] Dept: [phone] Dept Fax: [phone]  Medication Check visit via Virtual Video due to COVID-19  Patient ID:  Michelle SaucierRachel Blevins  female DOB: 10/04/2001   18 y.o.   MRN: [id number]   DATE:05/28/19  PCP: Deeann SaintBanks, Shannon R, MD  Virtual Visit via Video Note  I connected with  Michelle Saucierachel Blevins  and Michelle Saucierachel Blevins 's Mother (Name [name]) on 05/28/19 at  1:00 PM EDT by a video enabled telemedicine application and verified that I am speaking with the correct person using two identifiers. Patient & Parent Location: at home.   I discussed the limitations, risks, security and privacy concerns of performing an evaluation and management service by telephone and the availability of in person appointments. I also discussed with the parents that there may be a patient responsible charge related to this service. The parents expressed understanding and agreed to proceed.  Provider: Carron Curieawn M Paretta-Leahey, NP  Location: work location  HISTORY/CURRENT STATUS: Michelle SaucierRachel Blevins is here for medication management of the psychoactive medications for ADHD and review of educational and behavioral concerns.   Michelle Blevins currently taking Vyvanse, which is working well. Takes medication in the morning. Medication tends to wear off around 6-7:00 pm. Michelle Blevins is able to focus through school/homework.   Michelle Blevins is eating well (eating breakfast, lunch and dinner). No recent issues reported by patient.  Sleeping well (getting enough sleep), sleeping through the night.   EDUCATION: School: To attend [employer] Year/Grade: freshman  Performance/ Grades: above average Services: IEP/504 Plan and Other: disabiltiy services on campus  Michelle Blevins was out of school due to social distancing due to COVID-19 and participated in a home schooling program. TO attend summer transitioning program.   Activities/ Exercise:  intermittently-outside  Screen time: (phone, tablet, TV, computer): TV, computer, movies and phone.   MEDICAL HISTORY: Individual Medical History/ Review of Systems: Changes? :None recently reported.   Family Medical/ Social History: Changes? No Patient Lives with: mother  Current Medications:  Outpatient Encounter Medications as of 05/28/2019  Medication Sig  . ibuprofen (ADVIL,MOTRIN) 600 MG tablet Take 1 tablet (600 mg total) by mouth every 6 (six) hours as needed.  . Multiple Vitamin (MULTIVITAMIN WITH MINERALS) TABS tablet Take 1 tablet by mouth daily.  Marland Kitchen. VYVANSE 40 MG capsule Take 1 capsule (40 mg total) by mouth every morning.  Marland Kitchen. VYVANSE 50 MG capsule Take 1 capsule (50 mg total) by mouth daily. Take with vyvanse 40 mg to equal 90 mg  . [DISCONTINUED] VYVANSE 40 MG capsule Take 1 capsule (40 mg total) by mouth every morning.  . [DISCONTINUED] VYVANSE 50 MG capsule Take 1 capsule (50 mg total) by mouth daily. Take with vyvanse 40 mg to equal 90 mg  . loratadine (CLARITIN) 10 MG tablet Take 10 mg by mouth daily.   No facility-administered encounter medications on file as of 05/28/2019.    Medication Side Effects: None  MENTAL HEALTH: Mental Health Issues:   None recently reported    DIAGNOSES:    ICD-10-CM   1. ADHD (attention deficit hyperactivity disorder), combined type  F90.2 VYVANSE 40 MG capsule    VYVANSE 50 MG capsule  2. Dysgraphia  R27.8   3. Learning difficulty  F81.9   4. Mild persistent asthma with acute exacerbation  J45.31   5. patient counseled  Z79.899   6. Medication management  Z79.899     RECOMMENDATIONS:  Discussed recent history with  patient & parent with learning and health since last f/u visit.   Discussed school academic progress and recommended continued summer academic home school activities using appropriate accommodations for school.   Discussed continued need for routine, structure, motivation, reward and positive reinforcement with school.   Encouraged recommended limitations on TV, tablets, phones, video games and computers for non-educational activities.   Discussed need for bedtime routine, use of good sleep hygiene, no video games, TV or phones for an hour before bedtime.   Encouraged physical activity and outdoor play, maintaining social distancing.   Counseled medication pharmacokinetics, options, dosage, administration, desired effects, and possible side effects.   Vyvanse 50 mg and 40 mg each daily, # 30 with no RF's for each Rx. RX for above e-scribed and sent to pharmacy on record  [place], [place] - [place] [address] Phone: [phone] Fax: [phone]  I discussed the assessment and treatment plan with the patient & parent. The patient & parent was provided an opportunity to ask questions and all were answered. The patient & parent agreed with the plan and demonstrated an understanding of the instructions.   I provided 25 minutes of non-face-to-face time during this encounter. Completed record review for 10 minutes prior to the virtual video visit.   NEXT APPOINTMENT:  Return in about 3 months (around 08/28/2019) for follow up visit.  The patient was advised to call back or seek an in-person evaluation if the symptoms worsen or if the condition fails to improve as anticipated.  Medical Decision-making: More than 50% of the appointment was spent counseling and discussing diagnosis and management of symptoms with the patient and family.  Carolann Littler, NP

## 2019-07-09 ENCOUNTER — Telehealth: Payer: Self-pay

## 2019-07-09 NOTE — Telephone Encounter (Signed)
Verified home address with patient  

## 2019-09-14 ENCOUNTER — Other Ambulatory Visit: Payer: Self-pay

## 2019-09-14 ENCOUNTER — Encounter: Payer: 59 | Admitting: Family

## 2019-09-14 NOTE — Progress Notes (Deleted)
[facility]. [address] [place] [address] Dept: [phone] Dept Fax: [phone]  Medication Check visit via Virtual Video due to COVID-19  Patient ID:  Michelle Blevins  female DOB: [birth year]   18 y.o.   MRN: [id number]   DATE:09/14/19  PCP: Billie Ruddy, MD  Virtual Visit via Video Note  I connected with  Lawanna Kobus on 09/14/19 at 10:00 AM EDT by a video enabled telemedicine application and verified that I am speaking with the correct person using two identifiers. Patient/Parent Location: school    I discussed the limitations, risks, security and privacy concerns of performing an evaluation and management service by telephone and the availability of in person appointments. I also discussed with the parents that there may be a patient responsible charge related to this service. The parents expressed understanding and agreed to proceed.  Provider: Carolann Littler, NP  Location: private location  HISTORY/CURRENT STATUS: Michelle Blevins is here for medication management of the psychoactive medications for ADHD and review of educational and behavioral concerns.   Michelle Blevins currently taking ***  which is working well. Takes medication at *** am. Medication tends to wear off around ***. Michelle Blevins is able to focus through homework.   Michelle Blevins is eating well (eating breakfast, lunch and dinner).   Sleeping well (goes to bed at *** pm wakes at *** am), sleeping through the night.   EDUCATION: School: [employer]  Year/Grade: Freshman  Performance/ Grades:  Services: help as needed.   Michelle Blevins is currently in distance learning due to social distancing due to COVID-19 and will continue for at least: the fall semester  Activities/ Exercise: {desc; exercise peds:19433}  Screen time: (phone, tablet, TV, computer): ***  MEDICAL HISTORY: Individual Medical History/ Review of Systems:  Changes? :{EXAM; YES/NO:19492::"No"}  Family Medical/ Social History: Changes? {EXAM; YES/NO:19492::"No"} Patient Lives with: {CHL AMB LIVING TZGY:1749449675}  Current Medications:  Current Outpatient Medications on File Prior to Visit  Medication Sig Dispense Refill  . ibuprofen (ADVIL,MOTRIN) 600 MG tablet Take 1 tablet (600 mg total) by mouth every 6 (six) hours as needed. 30 tablet 0  . loratadine (CLARITIN) 10 MG tablet Take 10 mg by mouth daily.    . Multiple Vitamin (MULTIVITAMIN WITH MINERALS) TABS tablet Take 1 tablet by mouth daily.    Marland Kitchen VYVANSE 40 MG capsule Take 1 capsule (40 mg total) by mouth every morning. 30 capsule 0  . VYVANSE 50 MG capsule Take 1 capsule (50 mg total) by mouth daily. Take with vyvanse 40 mg to equal 90 mg 30 capsule 0   No current facility-administered medications on file prior to visit.     Medication Side Effects: {Medication Side Effects (Optional):21014029}  MENTAL HEALTH: Mental Health Issues:   {Mental Health Problems (Optional):21014030}    DIAGNOSES:  No diagnosis found.  RECOMMENDATIONS:  Discussed recent history with patient/parent  Discussed school academic progress and recommended continued accommodations for the new school year.  Referred to ADDitudemag.com for resources about using distance learning with children with ADHD  Children and young adults with ADHD often suffer from disorganization, difficulty with time management, completing projects and other executive function difficulties.  Recommended Reading: "Smart but Scattered" and "Smart but Scattered Teens" by Peg Renato Battles and Ethelene Browns.    Discussed continued need for structure, routine, reward (external), motivation (internal), positive reinforcement, consequences, and organization  Encouraged recommended limitations on TV, tablets, phones, video games and computers for non-educational  activities.   Discussed need for bedtime routine, use of good sleep hygiene, no video  games, TV or phones for an hour before bedtime.   Encouraged physical activity and outdoor play, maintaining social distancing.   Counseled medication pharmacokinetics, options, dosage, administration, desired effects, and possible side effects.   ***    I discussed the assessment and treatment plan with the patient/parent. The patient/parent was provided an opportunity to ask questions and all were answered. The patient/ parent agreed with the plan and demonstrated an understanding of the instructions.   I provided *** minutes of non-face-to-face time during this encounter.   Completed record review for *** minutes prior to the virtual *** visit.   NEXT APPOINTMENT:  No follow-ups on file.  The patient/parent was advised to call back or seek an in-person evaluation if the symptoms worsen or if the condition fails to improve as anticipated.  Medical Decision-making: More than 50% of the appointment was spent counseling and discussing diagnosis and management of symptoms with the patient and family.  Carron Curie, NP

## 2019-09-18 ENCOUNTER — Encounter: Payer: Self-pay | Admitting: Family

## 2019-09-18 NOTE — Progress Notes (Signed)
A user error has taken place: encounter opened in error, closed for administrative reasons.    This encounter was created in error - please disregard.  This encounter was created in error - please disregard.

## 2019-09-19 ENCOUNTER — Other Ambulatory Visit: Payer: Self-pay

## 2019-09-19 ENCOUNTER — Encounter: Payer: 59 | Admitting: Family

## 2019-09-20 ENCOUNTER — Telehealth: Payer: Self-pay | Admitting: Family

## 2019-09-20 NOTE — Telephone Encounter (Signed)
Provider reports that she tried to reach patient numerous times for her telehealth appointment with no success.

## 2019-09-27 ENCOUNTER — Encounter: Payer: Self-pay | Admitting: Family

## 2019-09-27 ENCOUNTER — Ambulatory Visit (INDEPENDENT_AMBULATORY_CARE_PROVIDER_SITE_OTHER): Payer: 59 | Admitting: Family

## 2019-09-27 DIAGNOSIS — Z558 Other problems related to education and literacy: Secondary | ICD-10-CM | POA: Diagnosis not present

## 2019-09-27 DIAGNOSIS — F902 Attention-deficit hyperactivity disorder, combined type: Secondary | ICD-10-CM | POA: Diagnosis not present

## 2019-09-27 DIAGNOSIS — J4531 Mild persistent asthma with (acute) exacerbation: Secondary | ICD-10-CM

## 2019-09-27 DIAGNOSIS — Z7189 Other specified counseling: Secondary | ICD-10-CM | POA: Diagnosis not present

## 2019-09-27 DIAGNOSIS — F819 Developmental disorder of scholastic skills, unspecified: Secondary | ICD-10-CM | POA: Diagnosis not present

## 2019-09-27 DIAGNOSIS — R278 Other lack of coordination: Secondary | ICD-10-CM | POA: Diagnosis not present

## 2019-09-27 NOTE — Progress Notes (Signed)
Patient ID: Michelle Blevins, female   DOB: [birth year], 18 y.o.   MRN: [id number]  [facility] [place]. [address] [place] [address] Dept: [phone] Dept Fax: [phone]  Medication Check visit via Virtual Video due to COVID-19  Patient ID:  Michelle Blevins  female DOB: 10/13/2001   17 y.o.   MRN: [id number]   DATE:09/27/19  PCP: Billie Ruddy, MD  Virtual Visit via Telephone Note Contacted  Michelle Blevins  and Michelle Blevins 's Mother (Name: Michelle Blevins) on 09/27/19 at  7:30 AM EDT by telephone and verified that I am speaking with the correct person using two identifiers. Patient/Parent Location: at work  I discussed the limitations, risks, security and privacy concerns of performing an evaluation and management service by telephone and the availability of in person appointments. I also discussed with the parents that there may be a patient responsible charge related to this service. The parents expressed understanding and agreed to proceed.  Provider: Carolann Littler, NP  Location: at work location  HISTORY/CURRENT STATUS: Michelle Blevins is here for medication management of the psychoactive medications for ADHD and review of educational and behavioral concerns.   Michelle Blevins currently taking Vyvanse 50 mg and 40 ng each, which is working well. Takes medication when she gets up in the morning. Medication tends to wear off around evening. Michelle Blevins is able to focus through school/homework.   Mother is concerned with Michelle Blevins's failing academics, she has missed classes, exams, not logging into complete homework and in danger of failing this semester classes.   Mother to meet with disability services coordinator on Monday 10/01/2019 and will devise a plan of action for success for the remainder of this semester.   Michelle Blevins is eating well (eating breakfast, lunch and dinner). Eating well per mother's report.   Sleeping well  (getting more than enough sleep), sleeping through the night.   EDUCATION: School: [employer] Year/Grade: college  Performance/ Grades: below average Services: Other: disability services on campus but patient not using  Michelle Blevins is currently in distance learning due to social distancing due to COVID-19 and will continue for at least: for the remainder of the semester.   Activities/ Exercise: rarely  Screen time: (phone, tablet, TV, computer): computer, phone and TV  MEDICAL HISTORY: Individual Medical History/ Review of Systems: Changes? :None reported recently.  Family Medical/ Social History: Changes? None recently reported Patient Lives with: 1 roommate at school in a dorm room.   Current Medications:  Current Outpatient Medications on File Prior to Visit  Medication Sig Dispense Refill  . ibuprofen (ADVIL,MOTRIN) 600 MG tablet Take 1 tablet (600 mg total) by mouth every 6 (six) hours as needed. 30 tablet 0  . loratadine (CLARITIN) 10 MG tablet Take 10 mg by mouth daily.    . Multiple Vitamin (MULTIVITAMIN WITH MINERALS) TABS tablet Take 1 tablet by mouth daily.    Marland Kitchen VYVANSE 40 MG capsule Take 1 capsule (40 mg total) by mouth every morning. 30 capsule 0  . VYVANSE 50 MG capsule Take 1 capsule (50 mg total) by mouth daily. Take with vyvanse 40 mg to equal 90 mg 30 capsule 0   No current facility-administered medications on file prior to visit.    Medication Side Effects: None  MENTAL HEALTH: Mental Health Issues:   Depression and Anxiety -mother is not sure about either of these, but concerned about recent issues since Michelle Blevins has been at school.   DIAGNOSES:  ICD-10-CM   1. ADHD (attention deficit hyperactivity disorder), combined type  F90.2   2. Dysgraphia  R27.8   3. Learning difficulty  F81.9   4. Mild persistent asthma with acute exacerbation  J45.31   5. Academic problem  Z55.8   6. Goals of care, counseling/discussion  Z71.89     RECOMMENDATIONS:  Discussed recent  history with parent related to academic concerns, missing appointments, and possible failure for this semester.  Advised mother to have Michelle Blevins sign up for counseling at school to assist with increased anxiety and/or depression for tools and guidance.  Will send anxiety and depression rating scales for Michelle Blevins to complete before Monday's appt.   Discussed school academic progress and recommended continued accommodations for this school year. Mother to meet with OARS team on Monday to assist with Michelle Blevins's academic struggles.   Discussed continued need for structure, routine, reward (external), motivation (internal), positive reinforcement, consequences, and organization with school and online classes.   Encouraged recommended limitations on TV, tablets, phones, video games and computers for non-educational activities.   Discussed need for bedtime routine, use of good sleep hygiene, no video games, TV or phones for an hour before bedtime.   Encouraged physical activity and outdoor play, maintaining social distancing.   Counseled medication pharmacokinetics, options, dosage, administration, desired effects, and possible side effects.   Continue with Vyvanse 50 mg and 40 mg each daily, no Rx today.  I discussed the assessment and treatment plan with the parent. The parent was provided an opportunity to ask questions and all were answered. The parent agreed with the plan and demonstrated an understanding of the instructions.   I provided 25 minutes of non-face-to-face time during this encounter.   Completed record review for 10 minutes prior to the virtual telehealth visit.   NEXT APPOINTMENT:  Return in about 4 days (around 10/01/2019) for medication check appointment with patient.  The parent was advised to call back or seek an in-person evaluation if the symptoms worsen or if the condition fails to improve as anticipated.  Medical Decision-making: More than 50% of the appointment was spent  counseling and discussing diagnosis and management of symptoms with the patient and family.  Michelle Curie, NP

## 2019-10-01 ENCOUNTER — Encounter: Payer: Self-pay | Admitting: Family

## 2019-10-01 ENCOUNTER — Ambulatory Visit (INDEPENDENT_AMBULATORY_CARE_PROVIDER_SITE_OTHER): Payer: 59 | Admitting: Family

## 2019-10-01 ENCOUNTER — Other Ambulatory Visit: Payer: Self-pay

## 2019-10-01 DIAGNOSIS — Z79899 Other long term (current) drug therapy: Secondary | ICD-10-CM

## 2019-10-01 DIAGNOSIS — F419 Anxiety disorder, unspecified: Secondary | ICD-10-CM | POA: Diagnosis not present

## 2019-10-01 DIAGNOSIS — F819 Developmental disorder of scholastic skills, unspecified: Secondary | ICD-10-CM | POA: Diagnosis not present

## 2019-10-01 DIAGNOSIS — J4531 Mild persistent asthma with (acute) exacerbation: Secondary | ICD-10-CM | POA: Diagnosis not present

## 2019-10-01 DIAGNOSIS — F902 Attention-deficit hyperactivity disorder, combined type: Secondary | ICD-10-CM

## 2019-10-01 DIAGNOSIS — R278 Other lack of coordination: Secondary | ICD-10-CM

## 2019-10-01 MED ORDER — VYVANSE 40 MG PO CAPS: 40.0000 mg | ORAL_CAPSULE | ORAL | 0 refills | Status: DC

## 2019-10-01 MED ORDER — VYVANSE 50 MG PO CAPS: 50.0000 mg | ORAL_CAPSULE | Freq: Every day | ORAL | 0 refills | Status: DC

## 2019-10-01 MED ORDER — AMPHETAMINE SULFATE 10 MG PO TABS: 10.0000 mg | ORAL_TABLET | Freq: Every evening | ORAL | 0 refills | Status: DC

## 2019-10-01 MED FILL — VYVANSE 40 MG CAPSULE: 40 | 30 days supply | Qty: 30 | Fill #0

## 2019-10-01 MED FILL — VYVANSE 50 MG CAPSULE: 50 | 30 days supply | Qty: 30 | Fill #0

## 2019-10-01 NOTE — Progress Notes (Signed)
[facility] [address], [place]. [address] [place] [address] Dept: [phone] Dept Fax: [phone]  Medication Check visit via Virtual Video due to COVID-19  Patient ID:  Michelle Blevins  female DOB: 11-26-2001   18 y.o.   MRN: [id number]   DATE:10/01/19  PCP: Michelle Saint, MD  Virtual Visit via Video Note  I connected with  Michelle Blevins  and Michelle Blevins 's Mother (Name Michelle Blevins) on 10/01/19 at 11:00 AM EDT by a video enabled telemedicine application and verified that I am speaking with the correct person using two identifiers. Patient/Parent Location: at school with Michelle Blevins at [employer] [facility]   I discussed the limitations, risks, security and privacy concerns of performing an evaluation and management service by telephone and the availability of in person appointments. I also discussed with the parents that there may be a patient responsible charge related to this service. The parents expressed understanding and agreed to proceed.  Provider: Carron Curie, NP  Location: private residence  HISTORY/CURRENT STATUS: Michelle Blevins is here for medication management of the psychoactive medications for ADHD and review of educational and behavioral concerns.   Michelle Blevins currently taking Vyvanse for most days, which is working well when she takes it, but doesn't last for homework time. Takes medication most days in the morning. Medication tends to wear off around 5:00 pm. Michelle Blevins is able to focus through school/homework.   Michelle Blevins is eating well (eating breakfast, lunch and dinner). Eating at least 3 times daily.  Sleeping well (goes to bed at 11 pm wakes at 7 am), sleeping through the night. 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   Michelle Blevins is  currently in distance learning due to social distancing due to COVID-19 and will continue for at least: for the Fall semester.   Activities/ Exercise: rarely  Screen time: (phone, tablet, TV, computer): computer, TV, phone  MEDICAL HISTORY: Individual Medical History/ Review of Systems: Changes? :None recently provided.   Family Medical/ Social History: Changes? None  Patient Lives with: one roommate  Current Medications:  Current Outpatient Medications  Medication Instructions  . Amphetamine Sulfate (EVEKEO) 10 mg, Oral, Every evening  . ibuprofen (ADVIL) 600 mg, Oral, Every 6 hours PRN  . loratadine (CLARITIN) 10 mg, Oral, Daily  . Multiple Vitamin (MULTIVITAMIN WITH MINERALS) TABS tablet 1 tablet, Oral, Daily  . Vyvanse 50 mg, Oral, Daily, Take with vyvanse 40 mg to equal 90 mg  . Vyvanse 40 mg, Oral, BH-each morning   Medication Side Effects: None  MENTAL HEALTH: Mental Health Issues:   Anxiety more now related to mother's expectations and Michelle Blevins feeling she can't keep up with them. To seek counseling on campus to assist with anxiousness.   DIAGNOSES:    ICD-10-CM   1. ADHD (attention deficit hyperactivity disorder), combined type  F90.2 VYVANSE 50 MG capsule    VYVANSE 40 MG capsule    Amphetamine Sulfate (EVEKEO) 10 MG TABS  2. Dysgraphia  Michelle Blevins   3. Learning difficulty  F81.9   4. patient counseled  Z79.899   5. Mild persistent asthma with acute exacerbation  J45.31   6. Medication management  Z79.899   7. Anxiousness  F41.9     RECOMMENDATIONS:  Discussed recent history with patient & parent related to school, Michelle Blevins, student  success on campus to assist with academics, medical and health updates with medication management.   Discussed school academic progress and recommended continued accommodations for the new school year, will meet with OARS today.   Referred to [employer] for resources about using distance learning with children with ADHD for  support.   Children and young adults with ADHD often suffer from disorganization, difficulty with time management, completing projects and other executive function difficulties.  Recommended Reading: "Smart but Scattered" and "Smart but Scattered Teens" by Michelle Blevins and Michelle Blevins.    Discussed continued need for structure, routine, reward (external), motivation (internal), positive reinforcement, consequences, and organization with school and services for the continued Fall semester.   Encouraged recommended limitations on TV, tablets, phones, video games and computers for non-educational activities.   Discussed need for bedtime routine, use of good sleep hygiene, no video games, TV or phones for an hour before bedtime.   Encouraged physical activity and outdoor exercise, maintaining social distancing.   Mother to email updates after meetings today for supportive measures to assist with the Fall semester and plan of action.  Advised mother and Michelle Blevins to set in writing agreement of expectations and how to reach those goals  Advocated for Michelle Blevins to seek counseling on campus to assist with anxiety and her ADHD symptoms.  Directed Michelle Blevins to set daily alarms for medication reminders, even on the weekends.  Counseled medication pharmacokinetics, options, dosage, administration, desired effects, and possible side effects.   Vyvanse 50 mg daily, # 30 with no RF's Vyvanse 40 mg daily, # 30 with no RF's Add Evekeo 10 mg for the evening, # 30 with no RF's.  RX for above e-scribed and sent to pharmacy on record  [place], [place]. [address] [place] [address] Phone: [phone] Fax: [phone]  I discussed the assessment and treatment plan with the patient & parent. The patient & parent was provided an opportunity to ask questions and all were answered. The patient & parent agreed with the plan and demonstrated an  understanding of the instructions.   I provided 45 minutes of non-face-to-face time during this encounter.   Completed record review for 10 minutes prior to the virtual video visit.   NEXT APPOINTMENT:  Return in about 2 weeks (around 10/15/2019) for follow up visit wiht patient. .  The patient & parent was advised to call back or seek an in-person evaluation if the symptoms worsen or if the condition fails to improve as anticipated.  Medical Decision-making: More than 50% of the appointment was spent counseling and discussing diagnosis and management of symptoms with the patient and family.  Carolann Littler, NP

## 2019-10-02 ENCOUNTER — Telehealth: Payer: Self-pay

## 2019-10-02 NOTE — Telephone Encounter (Signed)
Pharm faxed in Prior Auth for Evekeo. Last visit 10/01/2019 next visit 10/12/2019. Submitting Prior Auth to [facility]

## 2019-10-04 MED FILL — AMPHETAMINE SULFATE 10 MG T: 10 | 30 days supply | Qty: 30 | Fill #0

## 2019-10-12 ENCOUNTER — Encounter: Payer: Self-pay | Admitting: Family

## 2019-10-12 ENCOUNTER — Ambulatory Visit (INDEPENDENT_AMBULATORY_CARE_PROVIDER_SITE_OTHER): Payer: 59 | Admitting: Family

## 2019-10-12 DIAGNOSIS — Z7189 Other specified counseling: Secondary | ICD-10-CM

## 2019-10-12 DIAGNOSIS — Z79899 Other long term (current) drug therapy: Secondary | ICD-10-CM | POA: Diagnosis not present

## 2019-10-12 DIAGNOSIS — Z719 Counseling, unspecified: Secondary | ICD-10-CM

## 2019-10-12 DIAGNOSIS — R4589 Other symptoms and signs involving emotional state: Secondary | ICD-10-CM | POA: Diagnosis not present

## 2019-10-12 DIAGNOSIS — F819 Developmental disorder of scholastic skills, unspecified: Secondary | ICD-10-CM

## 2019-10-12 DIAGNOSIS — F902 Attention-deficit hyperactivity disorder, combined type: Secondary | ICD-10-CM | POA: Diagnosis not present

## 2019-10-12 DIAGNOSIS — F419 Anxiety disorder, unspecified: Secondary | ICD-10-CM

## 2019-10-12 DIAGNOSIS — R278 Other lack of coordination: Secondary | ICD-10-CM

## 2019-10-12 NOTE — Progress Notes (Signed)
[facility] [place]. [address] [place] [address] Dept: [phone] Dept Fax: [phone]  Medication Check visit via Virtual Video due to COVID-19  Patient ID:  Michelle Blevins  female DOB: 11/08/2001   18 y.o.   MRN: [id number]   DATE:10/12/19  PCP: Billie Ruddy, MD  Virtual Visit via Video Note  I connected with  Michelle Blevins on 10/12/19 at  1:00 PM EDT by a video enabled telemedicine application and verified that I am speaking with the correct person using two identifiers. Patient/Parent Location: at school   I discussed the limitations, risks, security and privacy concerns of performing an evaluation and management service by telephone and the availability of in person appointments. I also discussed with the parents that there may be a patient responsible charge related to this service. The parents expressed understanding and agreed to proceed.  Provider: Carolann Littler, NP  Location: private location  HISTORY/CURRENT STATUS: Michelle Blevins is here for medication management of the psychoactive medications for ADHD and review of educational and behavioral concerns.   Michelle Blevins currently taking Vyvanse and Evekeo, which is working well. Takes medication in the morning with food. Medication tends to wear off around evening that's when she takes her Evekeo to complete her homework. Michelle Blevins is able to focus through [employer].   Michelle Blevins is eating well (eating breakfast, lunch and dinner). Eating well.   Sleeping much better since not as much stress, sleeping through the night.    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   Activities/ Exercise: intermittently  Screen time: (phone, tablet, TV, computer): TV, Phone, and compouter  MEDICAL HISTORY: Individual Medical History/ Review of Systems: Changes? :No  Family Medical/ Social History: Changes? No Patient Lives with: roommate on [place]  Current Medications:  Current Outpatient Medications on File Prior to Visit  Medication Sig Dispense Refill   Amphetamine Sulfate (EVEKEO) 10 MG TABS Take 10 mg by mouth every evening. 30 tablet 0   ibuprofen (ADVIL,MOTRIN) 600 MG tablet Take 1 tablet (600 mg total) by mouth every 6 (six) hours as needed. (Patient not taking: Reported on 10/01/2019) 30 tablet 0   loratadine (CLARITIN) 10 MG tablet Take 10 mg by mouth daily.     Multiple Vitamin (MULTIVITAMIN WITH MINERALS) TABS tablet Take 1 tablet by mouth daily.     VYVANSE 40 MG capsule Take 1 capsule (40 mg total) by mouth every morning. 30 capsule 0   VYVANSE 50 MG capsule Take 1 capsule (50 mg total) by mouth daily. Take with vyvanse 40 mg to equal 90 mg 30 capsule 0   No current facility-administered medications on file prior to visit.    Medication Side Effects: None  MENTAL HEALTH: Mental Health Issues:   Anxiety and stress with expectations from mother. Now see Dr. Romie Minus weekly and less stress with navigating school work and help appropriately.     DIAGNOSES:    ICD-10-CM   1. ADHD (attention deficit hyperactivity disorder), combined type  F90.2   2. Dysgraphia  R27.8   3. Learning difficulty  F81.9   4. Anxiousness  F41.9   5. Sadness  R45.89   6. Medication management  Z79.899   7.  Patient counseled  Z71.9   8. Goals of care, counseling/discussion  Z71.89     RECOMMENDATIONS:  Discussed recent history with patient since last phone call related to school and anxiety. Reviewed rating scales with patient for anxiety and depression that she completed after last visit.   Discussed school academic progress and recommended continued  accommodations for the remainder of the school year. Meeting with academic success advisor weekly.   Discussed continued need for structure, routine, reward (external), motivation (internal), positive reinforcement, consequences, and organization with school needs and catching up with behind work.   Encouraged recommended limitations on TV, tablets, phones, video games and computers for non-educational activities. This will decrease disractions  Discussed need for bedtime routine, use of good sleep hygiene, no video games, TV or phones for an hour before bedtime.   Encouraged physical activity and outdoor exercise on campus maintaining social distancing.   Counseled medication pharmacokinetics, options, dosage, administration, desired effects, and possible side effects.   Continue with current medication of Vyvanse and Evekeo, no Rx's today.   I discussed the assessment and treatment plan with the patient. The patient/parent was provided an opportunity to ask questions and all were answered. The patient agreed with the plan and demonstrated an understanding of the instructions.   I provided 20 minutes of non-face-to-face time during this encounter.   Completed record review for 10 minutes prior to the virtual video visit.   NEXT APPOINTMENT:  Return in about 3 months (around 01/12/2020) for follow up visit.  The patient & parent was advised to call back or seek an in-person evaluation if the symptoms worsen or if the condition fails to improve as anticipated.  Medical Decision-making: More than 50% of the appointment was spent counseling and discussing diagnosis and management of symptoms with the patient and family.  Carron Curie, NP

## 2020-01-23 ENCOUNTER — Institutional Professional Consult (permissible substitution): Payer: 59 | Admitting: Family

## 2020-02-19 ENCOUNTER — Encounter: Payer: Self-pay | Admitting: Family

## 2020-02-19 ENCOUNTER — Ambulatory Visit (INDEPENDENT_AMBULATORY_CARE_PROVIDER_SITE_OTHER): Payer: 59 | Admitting: Family

## 2020-02-19 ENCOUNTER — Other Ambulatory Visit: Payer: Self-pay

## 2020-02-19 DIAGNOSIS — F819 Developmental disorder of scholastic skills, unspecified: Secondary | ICD-10-CM | POA: Diagnosis not present

## 2020-02-19 DIAGNOSIS — R278 Other lack of coordination: Secondary | ICD-10-CM

## 2020-02-19 DIAGNOSIS — Z79899 Other long term (current) drug therapy: Secondary | ICD-10-CM | POA: Diagnosis not present

## 2020-02-19 DIAGNOSIS — Z7189 Other specified counseling: Secondary | ICD-10-CM

## 2020-02-19 DIAGNOSIS — F902 Attention-deficit hyperactivity disorder, combined type: Secondary | ICD-10-CM | POA: Diagnosis not present

## 2020-02-19 MED ORDER — VYVANSE 40 MG PO CAPS: 40.0000 mg | ORAL_CAPSULE | ORAL | 0 refills | Status: DC

## 2020-02-19 MED ORDER — VYVANSE 50 MG PO CAPS: 50.0000 mg | ORAL_CAPSULE | Freq: Every day | ORAL | 0 refills | Status: DC

## 2020-02-19 MED ORDER — AMPHETAMINE SULFATE 10 MG PO TABS: 10.0000 mg | ORAL_TABLET | Freq: Every evening | ORAL | 0 refills | Status: DC

## 2020-02-19 MED FILL — AMPHETAMINE SULFATE 10 MG T: 10 | 30 days supply | Qty: 30 | Fill #0

## 2020-02-19 MED FILL — VYVANSE 40 MG CAPSULE: 40 | 30 days supply | Qty: 30 | Fill #0

## 2020-02-19 MED FILL — VYVANSE 50 MG CAPSULE: 50 | 30 days supply | Qty: 30 | Fill #0

## 2020-02-19 NOTE — Progress Notes (Signed)
Michelle Blevins. [address] [place] [address] Dept: [phone] Dept Fax: [phone]  Medication Check visit via Virtual Video due to COVID-19  Patient ID:  Michelle Blevins  female DOB: 2001/10/07   19 y.o.   MRN: [id number]   DATE:02/19/20  PCP: Billie Ruddy, MD  Virtual Visit via Video Note  I connected with  Michelle Blevins  and Michelle Blevins 's Mother (Name: Michelle Blevins ) on 02/19/20 at  8:00 AM EST by a video enabled telemedicine application and verified that I am speaking with the correct person using two identifiers. Patient/Parent Location: at home   I discussed the limitations, risks, security and privacy concerns of performing an evaluation and management service by telephone and the availability of in person appointments. I also discussed with the parents that there may be a patient responsible charge related to this service. The parents expressed understanding and agreed to proceed.  Provider: Carolann Littler, NP  Location: at home  HISTORY/CURRENT STATUS: Michelle Blevins is here for medication management of the psychoactive medications for ADHD and review of educational and behavioral concerns.   Michelle Blevins currently taking Vyvanse and Evekeo, which is working well. Takes medication as directed. Medication tends to last for the time needed. Michelle Blevins is able to focus through school/homework.   Michelle Blevins is eating well (eating breakfast, lunch and dinner). Eating with no real issues reported.   Sleeping well (getting about 6 hours on average), sleeping through the night.   EDUCATION: School: [employer] Year/Grade: college  Performance/ Grades: below average Services: IEP/504 Plan and Other: disability services on campus  Michelle Blevins is currently in distance learning due to social distancing due to COVID-19 and will continue through: the remainder of the school year. Michelle Blevins [employer]   Activities/  Exercise: rarely  Screen time: (phone, tablet, TV, computer): computer for learning, phone, TV, and movies with some increased time.   MEDICAL HISTORY: Individual Medical History/ Review of Systems: Changes? :None reported recently.   Family Medical/ Social History: Changes? None reported Patient Lives with: on campus with 1 roommate  Current Medications:  Current Outpatient Medications  Medication Instructions  . Amphetamine Sulfate (EVEKEO) 10 mg, Oral, Every evening  . ibuprofen (ADVIL) 600 mg, Oral, Every 6 hours PRN  . loratadine (CLARITIN) 10 mg, Oral, Daily  . Multiple Vitamin (MULTIVITAMIN WITH MINERALS) TABS tablet 1 tablet, Oral, Daily  . Vyvanse 50 mg, Oral, Daily, Take with vyvanse 40 mg to equal 90 mg  . Vyvanse 40 mg, Oral, BH-each morning   Medication Side Effects: None  MENTAL HEALTH: Mental Health Issues:   Anxiety-counseling for increased school stressors.   DIAGNOSES:    ICD-10-CM   1. ADHD (attention deficit hyperactivity disorder), combined type  F90.2 VYVANSE 50 MG capsule    VYVANSE 40 MG capsule    Amphetamine Sulfate (EVEKEO) 10 MG TABS  2. Dysgraphia  R27.8   3. Learning difficulty  F81.9   4. patient counseled  Z79.899   5. Medication management  Z79.899   6. Goals of care, counseling/discussion  Z71.89    RECOMMENDATIONS:  Discussed recent history with patient & parent with updates with progress for school, academic plan, education progress, health and medications.   Discussed school academic progress and recommended continued accommodations needed for learning progress.   Discussed growth and development and current weight. Recommended healthy food choices, watching portion sizes, avoiding second helpings, avoiding sugary drinks like soda and tea, drinking more  water, getting more exercise.   Discussed continued need for structure, routine, reward (external), motivation (internal), positive reinforcement, consequences, and organization   Encouraged recommended limitations on TV, tablets, phones, video games and computers for non-educational activities.   Discussed need for bedtime routine, use of good sleep hygiene, no video games, TV or phones for an hour before bedtime.   Encouraged physical activity and outdoor play, maintaining social distancing.   Counseled medication pharmacokinetics, options, dosage, administration, desired effects, and possible side effects.   Vyvanse 50 mg daily, # 30 with no RF's Vyvanse 40 mg daily, # 30 with no RF's Evekeo 10 mg daily in the evening, # 30 with no RF's RX for above e-scribed and sent to pharmacy on record  [facility] - [place], [place] - [address] [facility]. [address] [place] [address] Phone: [phone] Fax: [phone]  I discussed the assessment and treatment plan with the patient & parent. The patient & parent was provided an opportunity to ask questions and all were answered. The patient & parent agreed with the plan and demonstrated an understanding of the instructions.   I provided 25 minutes of non-face-to-face time during this encounter.   Completed record review for 10 minutes prior to the virtual video visit.   NEXT APPOINTMENT:  Return in about 3 months (around 05/21/2020) for follow up visit.  The patient & parent was advised to call back or seek an in-person evaluation if the symptoms worsen or if the condition fails to improve as anticipated.  Medical Decision-making: More than 50% of the appointment was spent counseling and discussing diagnosis and management of symptoms with the patient and family.  Carron Curie, NP

## 2020-03-31 ENCOUNTER — Institutional Professional Consult (permissible substitution): Payer: 59 | Admitting: Family

## 2020-04-10 ENCOUNTER — Other Ambulatory Visit: Payer: Self-pay

## 2020-04-10 ENCOUNTER — Encounter: Payer: Self-pay | Admitting: Family

## 2020-04-10 ENCOUNTER — Ambulatory Visit (INDEPENDENT_AMBULATORY_CARE_PROVIDER_SITE_OTHER): Payer: 59 | Admitting: Family

## 2020-04-10 VITALS — BP 118/68 | Temp 97.9°F | Resp 16 | Ht 64.0 in | Wt 232.0 lb

## 2020-04-10 DIAGNOSIS — F329 Major depressive disorder, single episode, unspecified: Secondary | ICD-10-CM

## 2020-04-10 DIAGNOSIS — F819 Developmental disorder of scholastic skills, unspecified: Secondary | ICD-10-CM | POA: Diagnosis not present

## 2020-04-10 DIAGNOSIS — F902 Attention-deficit hyperactivity disorder, combined type: Secondary | ICD-10-CM | POA: Diagnosis not present

## 2020-04-10 DIAGNOSIS — R278 Other lack of coordination: Secondary | ICD-10-CM | POA: Diagnosis not present

## 2020-04-10 DIAGNOSIS — F4322 Adjustment disorder with anxiety: Secondary | ICD-10-CM | POA: Diagnosis not present

## 2020-04-10 DIAGNOSIS — F32A Depression, unspecified: Secondary | ICD-10-CM

## 2020-04-10 DIAGNOSIS — Z7189 Other specified counseling: Secondary | ICD-10-CM

## 2020-04-10 DIAGNOSIS — Z79899 Other long term (current) drug therapy: Secondary | ICD-10-CM

## 2020-04-10 MED ORDER — BUPROPION HCL 75 MG PO TABS: 75.0000 mg | ORAL_TABLET | Freq: Two times a day (BID) | ORAL | 0 refills | Status: DC

## 2020-04-10 MED FILL — buPROPion HCL 75 MG TABS: 75 | 30 days supply | Qty: 60 | Fill #0

## 2020-04-10 NOTE — Progress Notes (Signed)
[facility] [address] [place] [address] Dept: [phone] Dept Fax: [phone] Loc: [phone] Loc Fax: [phone]  Medical Follow-up  Patient ID: Michelle Blevins, female  DOB: 20-Sep-2001, 19 y.o.  MRN: [id number]  Date of Evaluation: 04/10/2020  PCP: Michelle Saint, MD  Accompanied by: Mother Patient Lives with: mother  HISTORY/CURRENT STATUS:  HPI Patient here with mother in the waiting area to discuss recent concerns with academics and mood regulation. Michelle Blevins was very emotional today expressing her thoughts and feelings. Most are related to the relationship with her mother and high expectations she carries for Michelle Blevins.  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   Individual Medical History/Review of System Changes? None reported recently,   Allergies: Epiduo [adapalene-benzoyl peroxide]  Current Medications:  Current Outpatient Medications:  .  Amphetamine Sulfate (EVEKEO) 10 MG TABS, Take 10 mg by mouth every evening., Disp: 30 tablet, Rfl: 0 .  loratadine (CLARITIN) 10 MG tablet, Take 10 mg by mouth daily., Disp: , Rfl:  .  Multiple Vitamin (MULTIVITAMIN WITH MINERALS) TABS tablet, Take 1 tablet by mouth daily., Disp: , Rfl:  .  VYVANSE 40 MG capsule, Take 1 capsule (40 mg total) by mouth  every morning., Disp: 30 capsule, Rfl: 0 .  VYVANSE 50 MG capsule, Take 1 capsule (50 mg total) by mouth daily. Take with vyvanse 40 mg to equal 90 mg, Disp: 30 capsule, Rfl: 0 .  buPROPion (WELLBUTRIN) 75 MG tablet, Take 1 tablet (75 mg total) by mouth 2 (two) times daily., Disp: 60 tablet, Rfl: 0 .  ibuprofen (ADVIL,MOTRIN) 600 MG tablet, Take 1 tablet (600 mg total) by mouth every 6 (six) hours as needed. (Patient not taking: Reported on 10/01/2019), Disp: 30 tablet, Rfl: 0 Medication Side Effects: None  Family Medical/Social History Changes?: None reported recently  MENTAL HEALTH: Mental Health Issues: Depression and Anxiety-recently having issues related to school and pressure. No suicidal thoughts and ideations. Counseling intake and forms completed this week and sessions to start next week,  PHYSICAL EXAM: Vitals:  Today's Vitals   04/10/20 0825  BP: 118/68  Resp: 16  Temp: 97.9 F (36.6 C)  TempSrc: Temporal  Weight: 232 lb (105.2 kg)  Height: 5\' 4"  (1.626 m)  PainSc: 0-No pain  , 98 %ile (Z= 2.16) based on CDC (Girls, 2-20 Years) BMI-for-age based on BMI available as of 04/10/2020.  General Exam: Physical Exam Vitals reviewed.  Constitutional:      Appearance: She is well-developed.  HENT:     Head: Normocephalic and atraumatic.     Right Ear: External ear normal.     Left Ear: External ear normal.     Nose: Nose normal.  Eyes:     Conjunctiva/sclera: Conjunctivae normal.     Pupils: Pupils are equal, round, and reactive to light.  Cardiovascular:     Rate and Rhythm: Normal rate and regular rhythm.     Heart sounds: Normal heart sounds.  Abdominal:     General: Bowel sounds are normal.  Palpations: Abdomen is soft.  Musculoskeletal:        General: Normal range of motion.     Cervical back: Normal range of motion and neck supple.  Skin:    General: Skin is warm and dry.  Neurological:     Mental Status: She is alert and oriented to person, place, and  time.     Deep Tendon Reflexes: Reflexes are normal and symmetric.  Psychiatric:        Behavior: Behavior normal.        Thought Content: Thought content normal.        Judgment: Judgment normal.   Neurological: oriented to time, place, and person Cranial Nerves: normal  Neuromuscular:  Motor Mass: normal  Tone: normal Strength: normal  DTRs: 2+ and symmetric Overflow: none Reflexes: no tremors noted Sensory Exam: Vibratory: intact  Fine Touch: intact  DIAGNOSES:    ICD-10-CM   1. ADHD (attention deficit hyperactivity disorder), combined type  F90.2   2. Dysgraphia  R27.8   3. Learning difficulty  F81.9   4. Adjustment disorder with anxiety  F43.22   5. Acute depression  F32.9   6. patient counseled  Z79.899   7. Medication management  Z79.899   8. Goals of care, counseling/discussion  Z71.89     RECOMMENDATIONS:  1) Discussed services on campus and getting a mentor to advocate for herself.   2) Reviewed school and academic issues with support system needed for return to campus in the Fall.  3) Counseling to start as instructed by patient and mother next week. Support given to patient today for provided needed care for her mental health.   4) Encouraged organization along with visual reminders to assist with completion of ADL's and assignments.  5) Discussed recent issues with anxiety and depression. Support given and provided options to patient for treatment. Will start Wellbutrin 75 mg daily for 2 weeks in the morning, then go to BID dosing for 2 weeks, Rx for # 60 with no RF's. Use, dose, effects and side effects reviewed with patient and mother today. RX for above e-scribed and sent to pharmacy on record  [facility] [place] [place]. [address] [place] [address] Phone: [phone] Fax: [phone]  6) To continue with Vyvanse 50 mg daily, no Rx today. Will hold the 40 mg dose for now. Evekeo is only for  prn dosing with no Rx today.  7) Mother and patient verbalized understanding of all topics discussed at the visit today.   NEXT APPOINTMENT: Return in about 5 weeks (around 05/15/2020) for f/u visit.  Medical Decision-making: More than 50% of the appointment was spent counseling and discussing diagnosis and management of symptoms with the patient and family.  Carolann Littler, NP Counseling Time: 70 Total Contact Time: 90

## 2020-04-10 NOTE — Patient Instructions (Signed)
Wellbutrin 75 mg 1 in the morning with Vyvanse 50 mg daily for the next 2 weeks. Then you can add the afternoon for the Wellbutrin 75 mg dose for two weeks to go with your morning dose of the Wellbutrin 75 mg. If this is working with no side effects then call to change to the 150 mg XL of Wellbutrin.

## 2020-04-18 DIAGNOSIS — F812 Mathematics disorder: Secondary | ICD-10-CM | POA: Diagnosis not present

## 2020-04-18 DIAGNOSIS — F902 Attention-deficit hyperactivity disorder, combined type: Secondary | ICD-10-CM | POA: Diagnosis not present

## 2020-04-18 DIAGNOSIS — F341 Dysthymic disorder: Secondary | ICD-10-CM | POA: Diagnosis not present

## 2020-04-18 DIAGNOSIS — F8181 Disorder of written expression: Secondary | ICD-10-CM | POA: Diagnosis not present

## 2020-04-25 DIAGNOSIS — F812 Mathematics disorder: Secondary | ICD-10-CM | POA: Diagnosis not present

## 2020-04-25 DIAGNOSIS — F902 Attention-deficit hyperactivity disorder, combined type: Secondary | ICD-10-CM | POA: Diagnosis not present

## 2020-04-25 DIAGNOSIS — F341 Dysthymic disorder: Secondary | ICD-10-CM | POA: Diagnosis not present

## 2020-04-25 DIAGNOSIS — F8181 Disorder of written expression: Secondary | ICD-10-CM | POA: Diagnosis not present

## 2020-05-05 ENCOUNTER — Other Ambulatory Visit: Payer: Self-pay | Admitting: Family

## 2020-05-05 MED FILL — buPROPion HCL 75 MG TABS: 75 | 30 days supply | Qty: 60 | Fill #0

## 2020-05-05 NOTE — Telephone Encounter (Signed)
Wellbutrin 75 mg 2 times daily, # 60 with no RF"s.RX for above e-scribed and sent to pharmacy on record  [facility] - [place], [place] - [address] [facility]. [address] [place] [address] Phone: [phone] Fax: [phone]

## 2020-05-06 DIAGNOSIS — F812 Mathematics disorder: Secondary | ICD-10-CM | POA: Diagnosis not present

## 2020-05-06 DIAGNOSIS — F902 Attention-deficit hyperactivity disorder, combined type: Secondary | ICD-10-CM | POA: Diagnosis not present

## 2020-05-06 DIAGNOSIS — F8181 Disorder of written expression: Secondary | ICD-10-CM | POA: Diagnosis not present

## 2020-05-06 DIAGNOSIS — F341 Dysthymic disorder: Secondary | ICD-10-CM | POA: Diagnosis not present

## 2020-05-16 DIAGNOSIS — F812 Mathematics disorder: Secondary | ICD-10-CM | POA: Diagnosis not present

## 2020-05-16 DIAGNOSIS — F8181 Disorder of written expression: Secondary | ICD-10-CM | POA: Diagnosis not present

## 2020-05-16 DIAGNOSIS — F902 Attention-deficit hyperactivity disorder, combined type: Secondary | ICD-10-CM | POA: Diagnosis not present

## 2020-05-16 DIAGNOSIS — F341 Dysthymic disorder: Secondary | ICD-10-CM | POA: Diagnosis not present

## 2020-05-26 DIAGNOSIS — F8181 Disorder of written expression: Secondary | ICD-10-CM | POA: Diagnosis not present

## 2020-05-26 DIAGNOSIS — F902 Attention-deficit hyperactivity disorder, combined type: Secondary | ICD-10-CM | POA: Diagnosis not present

## 2020-05-26 DIAGNOSIS — F341 Dysthymic disorder: Secondary | ICD-10-CM | POA: Diagnosis not present

## 2020-05-26 DIAGNOSIS — F812 Mathematics disorder: Secondary | ICD-10-CM | POA: Diagnosis not present

## 2020-05-28 ENCOUNTER — Encounter: Payer: Self-pay | Admitting: Family

## 2020-05-28 ENCOUNTER — Ambulatory Visit (INDEPENDENT_AMBULATORY_CARE_PROVIDER_SITE_OTHER): Payer: 59 | Admitting: Family

## 2020-05-28 ENCOUNTER — Other Ambulatory Visit: Payer: Self-pay

## 2020-05-28 VITALS — BP 102/68 | HR 76 | Resp 16 | Ht 64.0 in | Wt 231.8 lb

## 2020-05-28 DIAGNOSIS — Z7189 Other specified counseling: Secondary | ICD-10-CM | POA: Diagnosis not present

## 2020-05-28 DIAGNOSIS — Z79899 Other long term (current) drug therapy: Secondary | ICD-10-CM

## 2020-05-28 DIAGNOSIS — Z719 Counseling, unspecified: Secondary | ICD-10-CM | POA: Diagnosis not present

## 2020-05-28 DIAGNOSIS — J4531 Mild persistent asthma with (acute) exacerbation: Secondary | ICD-10-CM | POA: Diagnosis not present

## 2020-05-28 DIAGNOSIS — R278 Other lack of coordination: Secondary | ICD-10-CM

## 2020-05-28 DIAGNOSIS — F819 Developmental disorder of scholastic skills, unspecified: Secondary | ICD-10-CM

## 2020-05-28 DIAGNOSIS — F902 Attention-deficit hyperactivity disorder, combined type: Secondary | ICD-10-CM

## 2020-05-28 MED ORDER — VYVANSE 50 MG PO CAPS: 50.0000 mg | ORAL_CAPSULE | Freq: Every day | ORAL | 0 refills | Status: DC

## 2020-05-28 MED ORDER — BUPROPION HCL ER (XL) 150 MG PO TB24: 150.0000 mg | ORAL_TABLET | Freq: Every day | ORAL | 2 refills | Status: DC

## 2020-05-28 MED FILL — buPROPion HCL ER (XL) 150 M: 150 | 30 days supply | Qty: 30 | Fill #0

## 2020-05-28 MED FILL — VYVANSE 50 MG CAPSULE: 50 | 30 days supply | Qty: 30 | Fill #0

## 2020-05-28 NOTE — Progress Notes (Signed)
[facility] [address] [place] [address] Dept: [phone] Dept Fax: [phone] Loc: [phone] Loc Fax: [phone]  Medication Check with follow up visit  Patient ID: Michelle Blevins, female  DOB: 2001/11/24, 19 y.o.  MRN: [id number]  Date of Evaluation: 05/28/2020  PCP: Billie Ruddy, MD  Accompanied by: Mother Patient Lives with: mother  HISTORY/CURRENT STATUS: HPI Patient here with mother today for the visit. Patient interactive and appropriate with provider today. More vocal today with provider and mother. Patient attempting to figure out the fall semester for classes to enroll in at this time and has to contact her advisor. Discussed organizational techniques patient has started with help from mother and counselor. Seeing Ophelia Charter for counseling services at least 1 time weekly right now and will contact counseling on campus to assist with ADHD symptoms. Has done well with her taking Wellbutrin 75 mg BID and only 50 mg Vyvanse 50 mg daily. No reported side effects but does report a positive change with emotional regulation along with her level of anxiety.   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 :None reported recently.   Allergies: Epiduo [adapalene-benzoyl peroxide]  Current Medications: Current Outpatient Medications  Medication Instructions  . Amphetamine Sulfate (EVEKEO) 10 mg, Oral, Every evening  . buPROPion (WELLBUTRIN XL) 150 mg, Oral, Daily  .  ibuprofen (ADVIL) 600 mg, Oral, Every 6 hours PRN  . loratadine (CLARITIN) 10 mg, Oral, Daily  . Multiple Vitamin (MULTIVITAMIN WITH MINERALS) TABS tablet 1 tablet, Oral, Daily  . Vyvanse 50 mg, Oral, Daily, Take with vyvanse 40 mg to equal 90 mg   Medication Side Effects: None Family Medical/ Social History: Changes? None  MENTAL HEALTH: Mental Health Issues: Depression and Anxiety-Wellbutrin 75 mg BID, better symptom control. No suicidal thoughts or ideations by patient. Continue with [place] counseling weekly.   PHYSICAL EXAM; Vitals:  Vitals:   05/28/20 0802  BP: 102/68  Pulse: 76  Resp: 16  Height: 5\' 4"  (1.626 m)  Weight: 231 lb 12.8 oz (105.1 kg)  BMI (Calculated): 39.77   General Physical Exam: Unchanged from previous exam, date: None Changed: None  Testing/Developmental Screens: not completed today and discussed her concerns.   DIAGNOSES:    ICD-10-CM   1. ADHD (attention deficit hyperactivity disorder), combined type  F90.2 VYVANSE 50 MG capsule  2. Dysgraphia  R27.8   3. Learning difficulty  F81.9   4. patient counseled  Z79.899   5. Mild persistent asthma with acute exacerbation  J45.31   6. Medication management  Z79.899   7. Patient counseled  Z71.9   8. Goals of care, counseling/discussion  Z71.89     RECOMMENDATIONS:  Counseling at this visit included the review of old records and/or current chart with the patient & parent with updates for school, academics, learning success, health and medication updates.   Reviewed and provided assistance with a list to initiate contact with college advisor, learning center, academic success and counseling office prior to this semester.   Discussed recent history and today's examination with patient & parent with no changes on exam today.   Counseled regarding  growth  and development reviewed with patient today-98 %ile (Z= 2.15) based on CDC (Girls, 2-20 Years) BMI-for-age based on BMI available as of 05/28/2020.  Will  continue to monitor.   Recommended a high protein, low sugar diet for ADHD patients, watch portion sizes, avoid second helpings, avoid sugary snacks and drinks, drink more water, eat more fruits and vegetables, increase daily exercise.  Discussed school academic and behavioral progress and advocated for appropriate accommodations needed for academic success.   Discussed importance of maintaining structure, routine, organization, reward, motivation and consequences with consistency with school, home and social interactions.   Counseled medication pharmacokinetics, options, dosage, administration, desired effects, and possible side effects.   Vyvanse 50 mg daily, # 30 with no RF's Wellbutrin 75 mg BID-Discontinued today Change to Wellbutrin XL 150 mg daily, # 30 with 2 RF"s May consider Wellbutrin XL 300 mg in the next 3-4 weeks Evekeo to restart with school semester in the fall, 10 mg pm, no Rx today Discontinue Vyvanse 40 mg daily RX for above e-scribed and sent to pharmacy on record  [facility] - [place], [place] - [address] [facility]. [address] [place] [address] Phone: [phone] Fax: [phone]  Advised importance of:  Good sleep hygiene (8- 10 hours per night, no TV or video games for 1 hour before bedtime) Limited screen time (none on school nights, no more than 2 hours/day on weekends, use of screen time for motivation) Regular exercise(outside and active play) Healthy eating (drink water or milk, no sodas/sweet tea, limit portions and no seconds).   NEXT APPOINTMENT: Return in about 3 months (around 08/28/2020) for follow up visit.   Medical Decision-making: More than 50% of the appointment was spent counseling and discussing diagnosis and management of symptoms with the patient and family.  Carron Curie, NP Counseling Time: 40 mins Total Contact Time: 45 mins

## 2020-06-09 DIAGNOSIS — F341 Dysthymic disorder: Secondary | ICD-10-CM | POA: Diagnosis not present

## 2020-06-09 DIAGNOSIS — F8181 Disorder of written expression: Secondary | ICD-10-CM | POA: Diagnosis not present

## 2020-06-09 DIAGNOSIS — F902 Attention-deficit hyperactivity disorder, combined type: Secondary | ICD-10-CM | POA: Diagnosis not present

## 2020-06-09 DIAGNOSIS — F812 Mathematics disorder: Secondary | ICD-10-CM | POA: Diagnosis not present

## 2020-06-12 ENCOUNTER — Encounter: Payer: Self-pay | Admitting: Family

## 2020-06-12 MED ORDER — BUPROPION HCL ER (XL) 300 MG PO TB24: 300.0000 mg | ORAL_TABLET | Freq: Every day | ORAL | 2 refills | Status: DC

## 2020-06-12 MED FILL — BUPROPION HCL ER (XL) 300 M: 300 | 30 days supply | Qty: 30 | Fill #0

## 2020-06-12 NOTE — Telephone Encounter (Signed)
Discontinue Wellbutrin XL 150 mg daily and change dose to Wellbutrin XL 300 mg daily, # 30 with 2 RF's.RX for above e-scribed and sent to pharmacy on record  [facility] - [place], [place] - [address] [facility]. [address] [place] [address] Phone: [phone] Fax: [phone]

## 2020-06-30 DIAGNOSIS — F341 Dysthymic disorder: Secondary | ICD-10-CM | POA: Diagnosis not present

## 2020-06-30 DIAGNOSIS — F8181 Disorder of written expression: Secondary | ICD-10-CM | POA: Diagnosis not present

## 2020-06-30 DIAGNOSIS — F902 Attention-deficit hyperactivity disorder, combined type: Secondary | ICD-10-CM | POA: Diagnosis not present

## 2020-07-02 ENCOUNTER — Encounter: Payer: Self-pay | Admitting: Family

## 2020-07-03 ENCOUNTER — Other Ambulatory Visit: Payer: Self-pay

## 2020-07-03 DIAGNOSIS — F902 Attention-deficit hyperactivity disorder, combined type: Secondary | ICD-10-CM

## 2020-07-03 MED ORDER — VYVANSE 50 MG PO CAPS: 50.0000 mg | ORAL_CAPSULE | Freq: Every day | ORAL | 0 refills | Status: DC

## 2020-07-03 MED FILL — VYVANSE 50 MG CAPSULE: 50 | 30 days supply | Qty: 30 | Fill #0

## 2020-07-03 NOTE — Telephone Encounter (Signed)
Patient emailed in for refill for Vyvanse. Last visit 05/28/2020 next visit 09/05/2020. Please escribe to [facility]

## 2020-07-03 NOTE — Telephone Encounter (Signed)
Vyvanse 50 mg daily, # 30 with no RF's. RX for above e-scribed and sent to pharmacy on record  [facility] - [place], [place] - [address] [place] [address] Phone: [phone] Fax: [phone]    

## 2020-07-10 ENCOUNTER — Encounter: Payer: Self-pay | Admitting: Family

## 2020-07-22 ENCOUNTER — Encounter: Payer: Self-pay | Admitting: Family

## 2020-07-22 MED FILL — BUPROPION HCL ER (XL) 300 M: 300 | 30 days supply | Qty: 30 | Fill #1

## 2020-08-15 DIAGNOSIS — F902 Attention-deficit hyperactivity disorder, combined type: Secondary | ICD-10-CM | POA: Diagnosis not present

## 2020-08-15 DIAGNOSIS — F8181 Disorder of written expression: Secondary | ICD-10-CM | POA: Diagnosis not present

## 2020-08-15 DIAGNOSIS — F341 Dysthymic disorder: Secondary | ICD-10-CM | POA: Diagnosis not present

## 2020-08-15 DIAGNOSIS — F812 Mathematics disorder: Secondary | ICD-10-CM | POA: Diagnosis not present

## 2020-08-25 IMAGING — DX DG CHEST 2V
2 series · 2 of 2 positions shown · non-contrast
Comparison: None.

CLINICAL DATA: Cough and congestion for 2 days

EXAM:
CHEST - 2 VIEW

[chest pa]
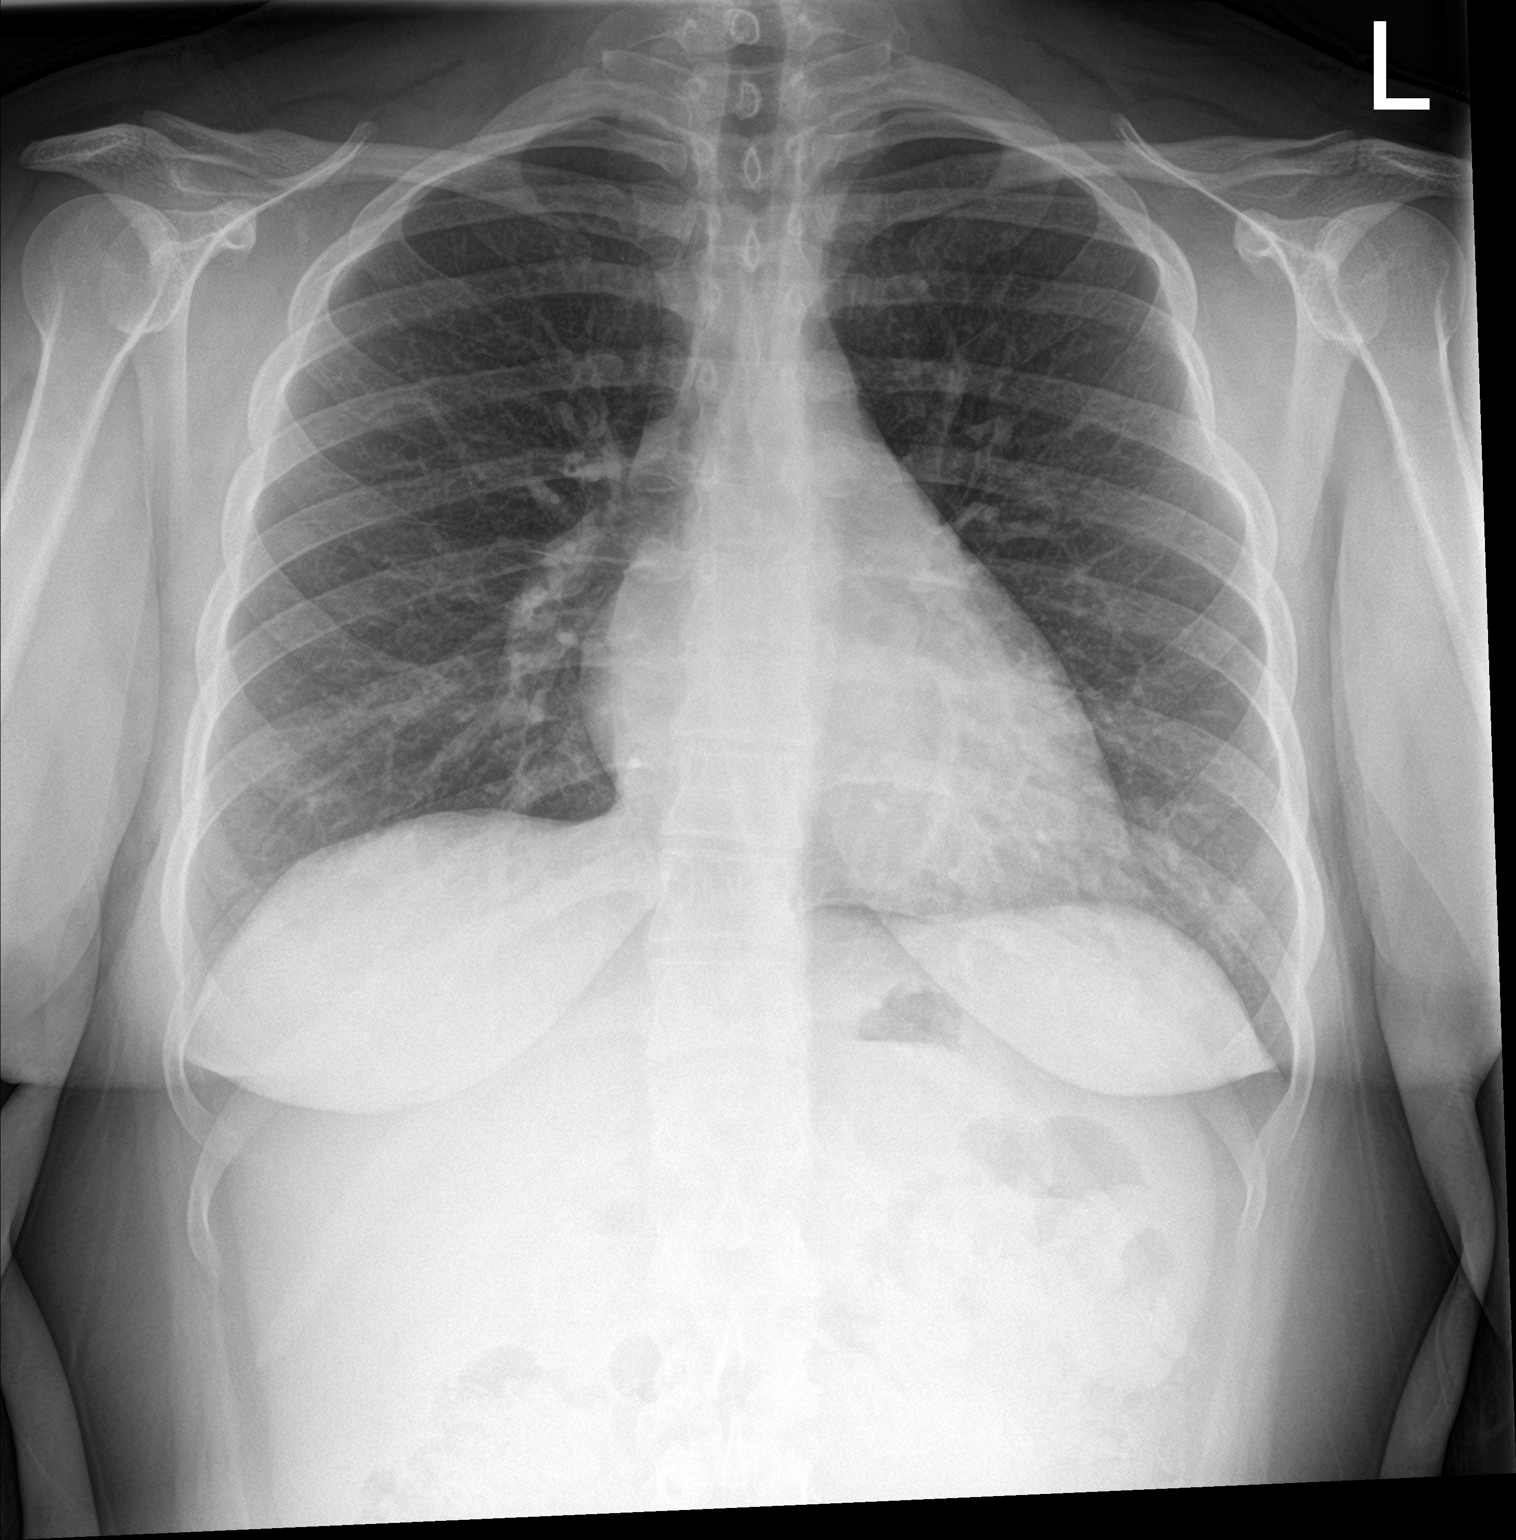

[chest lat]
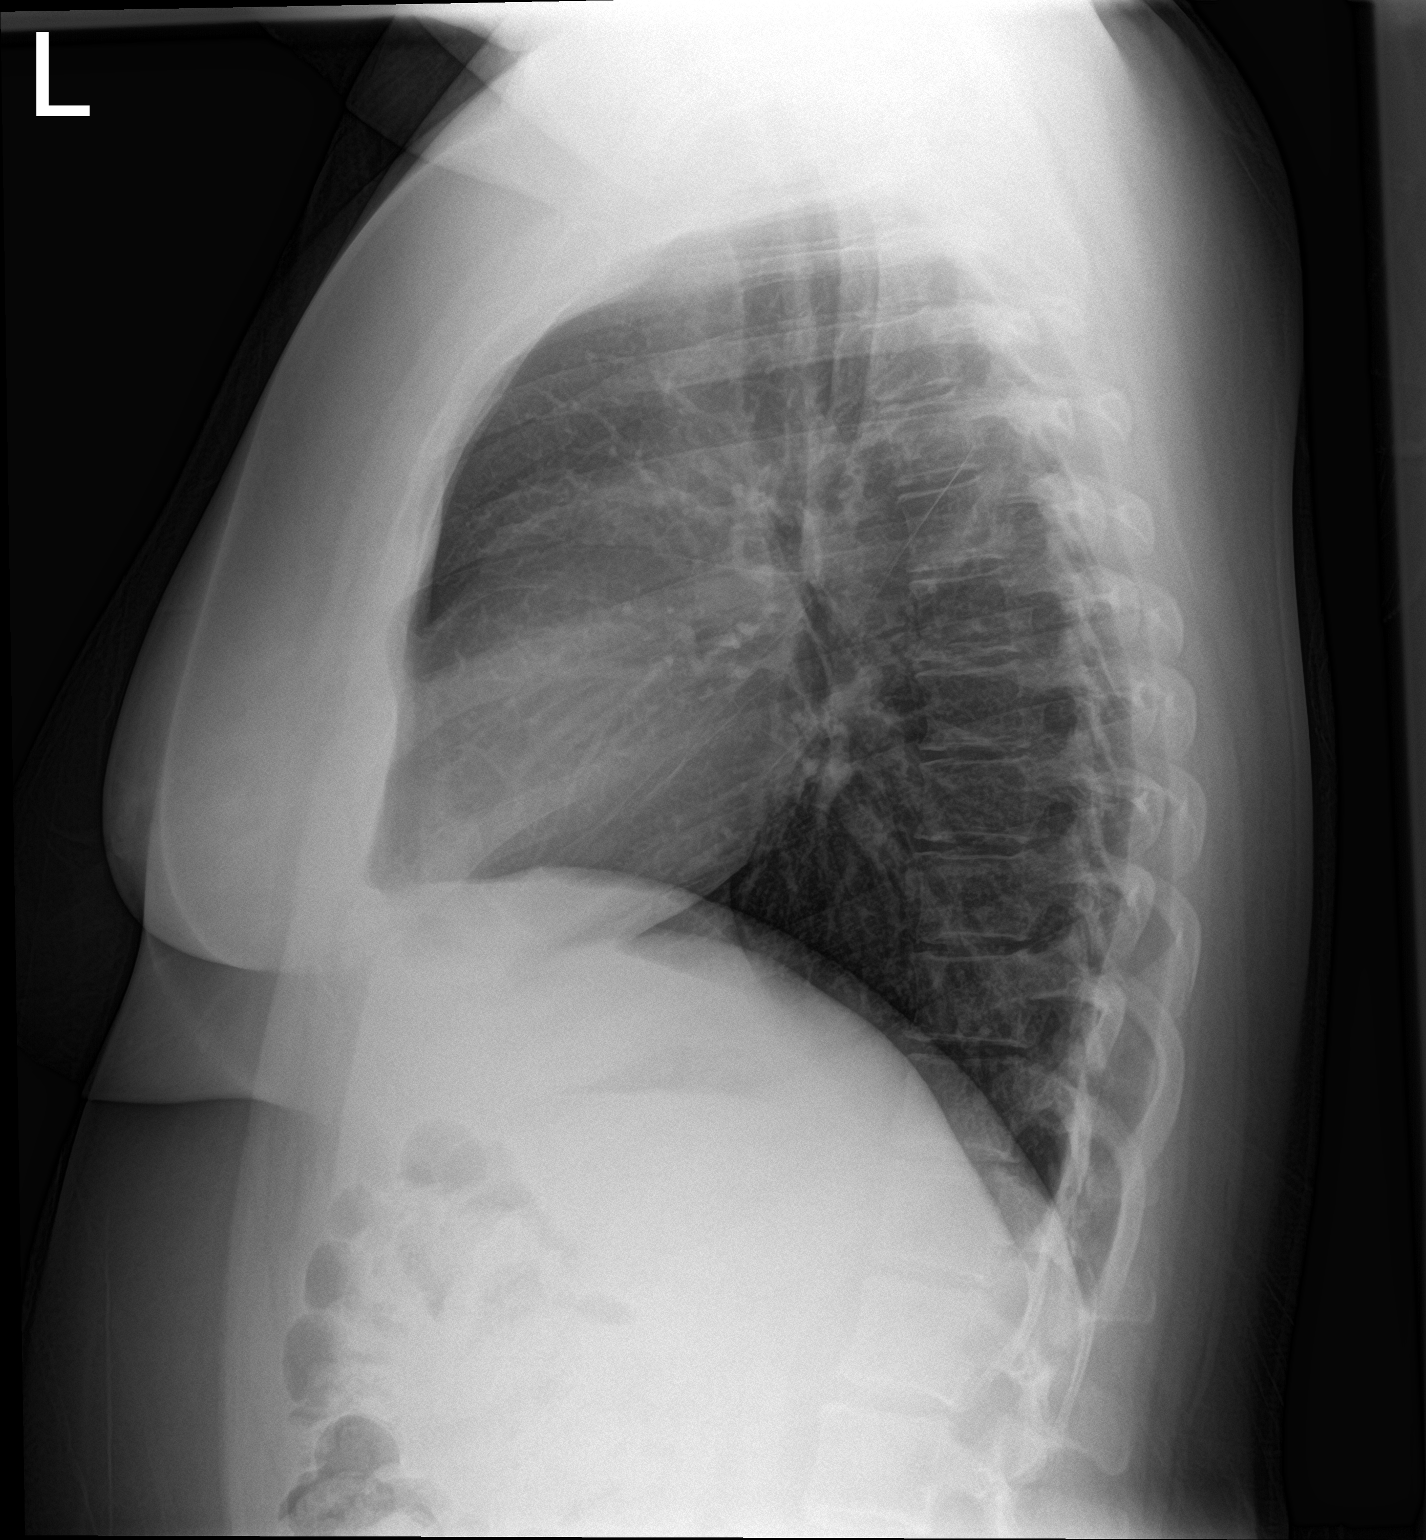

[2 of 2 positions shown; findings below may reference images not displayed]

FINDINGS: Prominent markings at the lung bases on the frontal view but no
focal airspace disease on the lateral view. There is no edema,
consolidation, effusion, or pneumothorax. Normal heart size and
mediastinal contours.
IMPRESSION: Negative chest.

## 2020-08-28 MED FILL — buPROPion HCL ER (XL) 300 M: 300 | 30 days supply | Qty: 30 | Fill #2

## 2020-09-05 ENCOUNTER — Other Ambulatory Visit: Payer: Self-pay | Admitting: Family

## 2020-09-05 ENCOUNTER — Telehealth (INDEPENDENT_AMBULATORY_CARE_PROVIDER_SITE_OTHER): Payer: 59 | Admitting: Family

## 2020-09-05 ENCOUNTER — Other Ambulatory Visit: Payer: Self-pay

## 2020-09-05 ENCOUNTER — Encounter: Payer: Self-pay | Admitting: Family

## 2020-09-05 DIAGNOSIS — Z7189 Other specified counseling: Secondary | ICD-10-CM | POA: Diagnosis not present

## 2020-09-05 DIAGNOSIS — Z79899 Other long term (current) drug therapy: Secondary | ICD-10-CM | POA: Diagnosis not present

## 2020-09-05 DIAGNOSIS — R278 Other lack of coordination: Secondary | ICD-10-CM

## 2020-09-05 DIAGNOSIS — F819 Developmental disorder of scholastic skills, unspecified: Secondary | ICD-10-CM | POA: Diagnosis not present

## 2020-09-05 DIAGNOSIS — F902 Attention-deficit hyperactivity disorder, combined type: Secondary | ICD-10-CM | POA: Diagnosis not present

## 2020-09-05 MED ORDER — BUPROPION HCL ER (XL) 300 MG PO TB24: 300.0000 mg | ORAL_TABLET | Freq: Every day | ORAL | 2 refills | Status: DC

## 2020-09-05 NOTE — Progress Notes (Signed)
[facility] [address] [place]. [address] [place] [address] Dept: [phone] Dept Fax: [phone]  Medication Check visit via Virtual Video due to COVID-19  Patient ID:  Michelle Blevins  female DOB: 2001/03/31   19 y.o.   MRN: [id number]   DATE:09/05/20  PCP: Deeann Saint, MD  Virtual Visit via Video Note  I connected with  Lady Saucier on 09/05/20 at  8:00 AM EDT by a video enabled telemedicine application and verified that I am speaking with the correct person using two identifiers. Patient/Parent Location: at home   I discussed the limitations, risks, security and privacy concerns of performing an evaluation and management service by telephone and the availability of in person appointments. I also discussed with the parents that there may be a patient responsible charge related to this service. The parents expressed understanding and agreed to proceed.  Provider: Carron Curie, NP  Location: private location  HISTORY/CURRENT STATUS: Tiffany Calmes is here for medication management of the psychoactive medications for ADHD and review of educational and behavioral concerns.   Darci currently taking Wellbutrin XL and Vyvanse in the morning before school, which is working well. Manasa is able to focus through school/homework.   Maurya is eating well (eating breakfast, lunch and dinner). No changes or concerns with issues.   Sleeping well (goes to bed at 9-11:00 pm wakes at 7:00 am), sleeping through the night. No issues with sleep per patient.   EDUCATION: School: [employer] -[employer], Psych, [place], [employer], start in October with college transfer success.      Year/Grade: college Monday through Friday Performance/ Grades: average Services: Other: Disability Services  Activities/ Exercise: intermittently  Screen time: (phone, tablet, TV, computer): piliates machine at home and working out on a  regular basis.   MEDICAL HISTORY: Individual Medical History/ Review of Systems: Changes? :None reported recently.   Family Medical/ Social History: Changes? None reported Patient Lives with: mother  Current Medications:  Current Outpatient Medications  Medication Instructions   Amphetamine Sulfate (EVEKEO) 10 mg, Oral, Every evening   buPROPion (WELLBUTRIN XL) 300 mg, Oral, Daily   ibuprofen (ADVIL) 600 mg, Oral, Every 6 hours PRN   loratadine (CLARITIN) 10 mg, Daily   Multiple Vitamin (MULTIVITAMIN WITH MINERALS) TABS tablet 1 tablet, Daily   Vyvanse 50 mg, Oral, Daily   Medication Side Effects: None  MENTAL HEALTH: Mental Health Issues:   Depression and Anxiety-good symptom control with Wellbutrin XL with no suicidal thoughts or ideations. Continued with counseling on a monthly basis.     DIAGNOSES:    ICD-10-CM   1. ADHD (attention deficit hyperactivity disorder), combined type  F90.2   2. Dysgraphia  R27.8   3. Learning difficulty  F81.9   4. patient counseled  Z79.899   5. Goals of care, counseling/discussion  Z71.89   6. Medication management  Z79.899     RECOMMENDATIONS:  Discussed recent history with patient with updates with school, academics, learning support, health and medications.   Discussed school academic progress and recommended continued accommodations as needed for learning at college.   Discussed growth and development and current weight. Recommended healthy food choices, watching portion sizes, avoiding second helpings, avoiding sugary drinks like soda and tea, drinking more water, getting more exercise.   Discussed continued need for structure, routine, reward (external), motivation (internal), positive reinforcement, consequences, and organization with school, home and social settings.   Encouraged recommended limitations on TV, tablets, phones, video  games and computers for non-educational activities.   Discussed need for bedtime routine, use  of good sleep hygiene, no video games, TV or phones for an hour before bedtime.   Encouraged physical activity and outdoor play, maintaining social distancing.   Counseled medication pharmacokinetics, options, dosage, administration, desired effects, and possible side effects.   Vyvanse 50 mg daily, no Rx today Evekeo 10 mg daily, no Rx today Wellbutrin XL 300 mg daily, # 30 with 2 RF's RX for above e-scribed and sent to pharmacy on record  [facility] - [place], [place] - [address] [facility]. [address] [place] [address] Phone: [phone] Fax: [phone]  I discussed the assessment and treatment plan with the patient/parent. The patient/parent was provided an opportunity to ask questions and all were answered. The patient/ parent agreed with the plan and demonstrated an understanding of the instructions.   I provided 25 minutes of non-face-to-face time during this encounter. Completed record review for 10 minutes prior to the virtual video visit.   NEXT APPOINTMENT:  No follow-ups on file.  The patient was advised to call back or seek an in-person evaluation if the symptoms worsen or if the condition fails to improve as anticipated.  Medical Decision-making: More than 50% of the appointment was spent counseling and discussing diagnosis and management of symptoms with the patient and family.  Carron Curie, NP

## 2020-09-08 DIAGNOSIS — F8181 Disorder of written expression: Secondary | ICD-10-CM | POA: Diagnosis not present

## 2020-09-08 DIAGNOSIS — F812 Mathematics disorder: Secondary | ICD-10-CM | POA: Diagnosis not present

## 2020-09-08 DIAGNOSIS — F341 Dysthymic disorder: Secondary | ICD-10-CM | POA: Diagnosis not present

## 2020-09-08 DIAGNOSIS — F902 Attention-deficit hyperactivity disorder, combined type: Secondary | ICD-10-CM | POA: Diagnosis not present

## 2020-10-01 DIAGNOSIS — F341 Dysthymic disorder: Secondary | ICD-10-CM | POA: Diagnosis not present

## 2020-10-01 DIAGNOSIS — F902 Attention-deficit hyperactivity disorder, combined type: Secondary | ICD-10-CM | POA: Diagnosis not present

## 2020-10-01 DIAGNOSIS — F812 Mathematics disorder: Secondary | ICD-10-CM | POA: Diagnosis not present

## 2020-10-01 DIAGNOSIS — F8181 Disorder of written expression: Secondary | ICD-10-CM | POA: Diagnosis not present

## 2020-10-13 MED FILL — buPROPion HCL ER (XL) 300 M: 300 | 30 days supply | Qty: 30 | Fill #0

## 2020-11-19 DIAGNOSIS — F812 Mathematics disorder: Secondary | ICD-10-CM | POA: Diagnosis not present

## 2020-11-19 DIAGNOSIS — F902 Attention-deficit hyperactivity disorder, combined type: Secondary | ICD-10-CM | POA: Diagnosis not present

## 2020-11-19 DIAGNOSIS — F8181 Disorder of written expression: Secondary | ICD-10-CM | POA: Diagnosis not present

## 2020-11-19 DIAGNOSIS — F341 Dysthymic disorder: Secondary | ICD-10-CM | POA: Diagnosis not present

## 2020-11-21 MED FILL — buPROPion HCL ER (XL) 300 M: 300 | 30 days supply | Qty: 30 | Fill #1

## 2020-12-01 ENCOUNTER — Telehealth: Payer: 59 | Admitting: Family

## 2020-12-01 DIAGNOSIS — R059 Cough, unspecified: Secondary | ICD-10-CM

## 2020-12-01 MED ORDER — BENZONATATE 100 MG PO CAPS: 100.0000 mg | ORAL_CAPSULE | Freq: Three times a day (TID) | ORAL | 0 refills | Status: DC | PRN

## 2020-12-01 MED ORDER — PREDNISONE 10 MG (21) PO TBPK: ORAL_TABLET | ORAL | 0 refills | Status: DC

## 2020-12-01 NOTE — Progress Notes (Signed)
We are sorry that you are not feeling well.  Here is how we plan to help!  Based on your presentation I believe you most likely have A cough due to a virus.  This is called viral bronchitis and is best treated by rest, plenty of fluids and control of the cough.  You may use Ibuprofen or Tylenol as directed to help your symptoms.     In addition you may use A non-prescription cough medication called Mucinex DM: take 2 tablets every 12 hours. and A prescription cough medication called Tessalon Perles 100mg . You may take 1-2 capsules every 8 hours as needed for your cough.  Prednisone 10 mg daily for 6 days (see taper instructions below)  Directions for 6 day taper: Day 1: 2 tablets before breakfast, 1 after both lunch & dinner and 2 at bedtime Day 2: 1 tab before breakfast, 1 after both lunch & dinner and 2 at bedtime Day 3: 1 tab at each meal & 1 at bedtime Day 4: 1 tab at breakfast, 1 at lunch, 1 at bedtime Day 5: 1 tab at breakfast & 1 tab at bedtime Day 6: 1 tab at breakfast   From your responses in the eVisit questionnaire you describe inflammation in the upper respiratory tract which is causing a significant cough.  This is commonly called Bronchitis and has four common causes:    Allergies  Viral Infections  Acid Reflux  Bacterial Infection Allergies, viruses and acid reflux are treated by controlling symptoms or eliminating the cause. An example might be a cough caused by taking certain blood pressure medications. You stop the cough by changing the medication. Another example might be a cough caused by acid reflux. Controlling the reflux helps control the cough.  USE OF BRONCHODILATOR ("RESCUE") INHALERS: There is a risk from using your bronchodilator too frequently.  The risk is that over-reliance on a medication which only relaxes the muscles surrounding the breathing tubes can reduce the effectiveness of medications prescribed to reduce swelling and congestion of the tubes  themselves.  Although you feel brief relief from the bronchodilator inhaler, your asthma may actually be worsening with the tubes becoming more swollen and filled with mucus.  This can delay other crucial treatments, such as oral steroid medications. If you need to use a bronchodilator inhaler daily, several times per day, you should discuss this with your provider.  There are probably better treatments that could be used to keep your asthma under control.     HOME CARE . Only take medications as instructed by your medical team. . Complete the entire course of an antibiotic. . Drink plenty of fluids and get plenty of rest. . Avoid close contacts especially the very young and the elderly . Cover your mouth if you cough or cough into your sleeve. . Always remember to wash your hands . A steam or ultrasonic humidifier can help congestion.   GET HELP RIGHT AWAY IF: . You develop worsening fever. . You become short of breath . You cough up blood. . Your symptoms persist after you have completed your treatment plan MAKE SURE YOU   Understand these instructions.  Will watch your condition.  Will get help right away if you are not doing well or get worse.  Your e-visit answers were reviewed by a board certified advanced clinical practitioner to complete your personal care plan.  Depending on the condition, your plan could have included both over the counter or prescription medications. If there is a  problem please reply  once you have received a response from your provider. Your safety is important to [place].  If you have drug allergies check your prescription carefully.    You can use MyChart to ask questions about today's visit, request a non-urgent call back, or ask for a work or school excuse for 24 hours related to this e-Visit. If it has been greater than 24 hours you will need to follow up with your provider, or enter a new e-Visit to address those concerns. You will get an e-mail in the next  two days asking about your experience.  I hope that your e-visit has been valuable and will speed your recovery. Thank you for using e-visits.  Approximately 5 minutes was spent documenting and reviewing patient's chart.

## 2020-12-03 ENCOUNTER — Encounter: Payer: Self-pay | Admitting: Family

## 2020-12-03 ENCOUNTER — Other Ambulatory Visit: Payer: Self-pay | Admitting: Family

## 2020-12-03 ENCOUNTER — Other Ambulatory Visit: Payer: Self-pay

## 2020-12-03 ENCOUNTER — Telehealth (INDEPENDENT_AMBULATORY_CARE_PROVIDER_SITE_OTHER): Payer: 59 | Admitting: Family

## 2020-12-03 DIAGNOSIS — Z7189 Other specified counseling: Secondary | ICD-10-CM | POA: Diagnosis not present

## 2020-12-03 DIAGNOSIS — F819 Developmental disorder of scholastic skills, unspecified: Secondary | ICD-10-CM

## 2020-12-03 DIAGNOSIS — R278 Other lack of coordination: Secondary | ICD-10-CM | POA: Diagnosis not present

## 2020-12-03 DIAGNOSIS — F902 Attention-deficit hyperactivity disorder, combined type: Secondary | ICD-10-CM | POA: Diagnosis not present

## 2020-12-03 DIAGNOSIS — Z79899 Other long term (current) drug therapy: Secondary | ICD-10-CM

## 2020-12-03 MED ORDER — VYVANSE 50 MG PO CAPS: 50.0000 mg | ORAL_CAPSULE | Freq: Every day | ORAL | 0 refills | Status: DC

## 2020-12-03 MED ORDER — BUPROPION HCL ER (XL) 300 MG PO TB24: 300.0000 mg | ORAL_TABLET | Freq: Every day | ORAL | 2 refills | Status: DC

## 2020-12-03 MED FILL — VYVANSE 50 MG CAPSULE: 50 | 30 days supply | Qty: 30 | Fill #0

## 2020-12-03 NOTE — Progress Notes (Signed)
[facility] [address], [place]. [address] [place] [address] Dept: [phone] Dept Fax: [phone]  Medication Check visit via Virtual Video   Patient ID:  Michelle Blevins  female DOB: 2001/01/29   19 y.o.   MRN: [id number]   DATE:12/03/20  PCP: Deeann Saint, MD  Virtual Visit via Video Note  I connected with  Lady Saucier on 12/03/20 at  8:00 AM EST by a video enabled telemedicine application and verified that I am speaking with the correct person using two identifiers. Patient/Parent Location:at home   I discussed the limitations, risks, security and privacy concerns of performing an evaluation and management service by telephone and the availability of in person appointments. I also discussed with the parents that there may be a patient responsible charge related to this service. The parents expressed understanding and agreed to proceed.  Provider: Carron Curie, NP  Location: private work location  HISTORY/CURRENT STATUS: Zitlaly Malson is here for medication management of the psychoactive medications for ADHD and review of educational and behavioral concerns.   Eleasha currently taking Wellbutrin XL and Vyvanse, which is working well. Takes medication at 7-9:00 am. Medication tends to wear off around evening time. Betsi is able to focus through school/homework.   Tishanna is eating well (eating breakfast, lunch and dinner). Eating well but not as much recently.   Sleeping well (goes to bed at 11:00 pm wakes at 7-9:00 am), sleeping through the night.   EDUCATION: School: [employer] Year/Grade: college  Performance/ Grades: average Services: Other: Disability services  Activities/ Exercise: walking the dog  Screen time: (phone, tablet, TV, computer): computer for learning, phone, TV, movies and games.   MEDICAL HISTORY: Individual Medical History/ Review of Systems: Changes? :Yes recent URI  recently waiting for virtual visit.   Family Medical/ Social History: Changes? None Patient Lives with: mother  Current Medications:  Current Outpatient Medications  Medication Instructions  . Amphetamine Sulfate (EVEKEO) 10 mg, Oral, Every evening  . benzonatate (TESSALON PERLES) 100 mg, Oral, 3 times daily PRN  . buPROPion (WELLBUTRIN XL) 300 mg, Oral, Daily  . ibuprofen (ADVIL) 600 mg, Oral, Every 6 hours PRN  . loratadine (CLARITIN) 10 mg, Daily  . Multiple Vitamin (MULTIVITAMIN WITH MINERALS) TABS tablet 1 tablet, Daily  . predniSONE (STERAPRED UNI-PAK 21 TAB) 10 MG (21) TBPK tablet Use as directed  . Vyvanse 50 mg, Oral, Daily   Medication Side Effects: None  MENTAL HEALTH: Mental Health Issues:   Depression and Anxiety-Wellbutrin XL with good symptom control.  DIAGNOSES:    ICD-10-CM   1. ADHD (attention deficit hyperactivity disorder), combined type  F90.2 VYVANSE 50 MG capsule  2. Dysgraphia  R27.8   3. Learning difficulty  F81.9   4. patient counseled  Z79.899   5. Medication management  Z79.899   6. Goals of care, counseling/discussion  Z71.89     RECOMMENDATIONS:  Discussed recent history with patient with updates for school, learning, academics, health and medications.   Discussed school academic progress and recommended continued accommodations for the school year for learning support.   Discussed health and current weight. Recommended healthy food choices, watching portion sizes, avoiding second helpings, avoiding sugary drinks like soda and tea, drinking more water, getting more exercise.   Discussed continued need for structure, routine, reward (external), motivation (internal), positive reinforcement, consequences, and organization with school, home and social interactions.   Encouraged recommended limitations on TV, tablets, phones, video games and computers  for non-educational activities.   Discussed need for bedtime routine, use of good sleep hygiene,  no video games, TV or phones for an hour before bedtime.   Encouraged physical activity and outdoor play, maintaining social distancing.   Counseled medication pharmacokinetics, options, dosage, administration, desired effects, and possible side effects.   Wellbutrin XL 300 mg daily, # 30 with 2 RF's Vyvanse 50 mg daily, # 30 with no RF's RX for above e-scribed and sent to pharmacy on record  [facility] - [place], [place] - [address] [facility]. [address] [place] [address] Phone: [phone] Fax: [phone]  I discussed the assessment and treatment plan with the patient. The patient was provided an opportunity to ask questions and all were answered. The patient agreed with the plan and demonstrated an understanding of the instructions.   I provided 25 minutes of non-face-to-face time during this encounter. Completed record review for 10 minutes prior to the virtual video visit.   NEXT APPOINTMENT:  Return in about 3 months (around 03/03/2021) for f/u visit.  The patient was advised to call back or seek an in-person evaluation if the symptoms worsen or if the condition fails to improve as anticipated.  Medical Decision-making: More than 50% of the appointment was spent counseling and discussing diagnosis and management of symptoms with the patient and family.  Carron Curie, NP

## 2020-12-30 MED FILL — buPROPion HCL ER (XL) 300 M: 300 | 30 days supply | Qty: 30 | Fill #2

## 2021-03-03 ENCOUNTER — Telehealth (INDEPENDENT_AMBULATORY_CARE_PROVIDER_SITE_OTHER): Payer: 59 | Admitting: Family

## 2021-03-03 ENCOUNTER — Encounter: Payer: Self-pay | Admitting: Family

## 2021-03-03 ENCOUNTER — Other Ambulatory Visit: Payer: Self-pay | Admitting: Family

## 2021-03-03 ENCOUNTER — Other Ambulatory Visit: Payer: Self-pay

## 2021-03-03 DIAGNOSIS — Z7189 Other specified counseling: Secondary | ICD-10-CM

## 2021-03-03 DIAGNOSIS — F902 Attention-deficit hyperactivity disorder, combined type: Secondary | ICD-10-CM | POA: Diagnosis not present

## 2021-03-03 DIAGNOSIS — R278 Other lack of coordination: Secondary | ICD-10-CM

## 2021-03-03 DIAGNOSIS — Z79899 Other long term (current) drug therapy: Secondary | ICD-10-CM

## 2021-03-03 DIAGNOSIS — F819 Developmental disorder of scholastic skills, unspecified: Secondary | ICD-10-CM | POA: Diagnosis not present

## 2021-03-03 DIAGNOSIS — Z8659 Personal history of other mental and behavioral disorders: Secondary | ICD-10-CM

## 2021-03-03 DIAGNOSIS — F4322 Adjustment disorder with anxiety: Secondary | ICD-10-CM

## 2021-03-03 MED ORDER — VYVANSE 50 MG PO CAPS: 50.0000 mg | ORAL_CAPSULE | Freq: Every day | ORAL | 0 refills | Status: DC

## 2021-03-03 MED ORDER — BUPROPION HCL ER (XL) 300 MG PO TB24: 300.0000 mg | ORAL_TABLET | Freq: Every day | ORAL | 2 refills | Status: DC

## 2021-03-03 MED FILL — VYVANSE 50 MG CAPSULE: 50 | 30 days supply | Qty: 30 | Fill #0

## 2021-03-03 NOTE — Progress Notes (Signed)
[facility] [address] [place]. [address] [place] [address] Dept: [phone] Dept Fax: [phone]  Medication Check visit via Virtual Video   Patient ID:  Michelle Blevins  female DOB: 31-Jan-2001   20 y.o.   MRN: [id number]   DATE:03/03/21  PCP: Deeann Saint, MD  Virtual Visit via Video Note  I connected with  Lady Saucier on 03/03/21 at  8:00 AM EDT by a video enabled telemedicine application and verified that I am speaking with the correct person using two identifiers. Patient/Parent Location: at home    I discussed the limitations, risks, security and privacy concerns of performing an evaluation and management service by telephone and the availability of in person appointments. I also discussed with the parents that there may be a patient responsible charge related to this service. The parents expressed understanding and agreed to proceed.  Provider: Carron Curie, NP  Location: private work location  HPI/CURRENT STATUS: Juanna Pudlo is here for medication management of the psychoactive medications for ADHD and review of educational and behavioral concerns.   Kristyn currently taking Wellbutrin XL and Vyanse with prn Evekeo in the afternoon, which is working well. Takes medication as directed daily. Medication tends to last for the time needed. Adaira is able to focus through school/homework.   Aneisha is eating well (eating breakfast, lunch and dinner). No changes with appetite and getting a good variety of foods.   Sleeping well (goes to bed at 11:00 pm wakes at 6:00 am), sleeping through the night. Gets in bed at 10:00 pm and not falling asleep for an hour with use ASMR video/sounds.   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   MEDICAL HISTORY: Individual Medical History/ Review of Systems: None  Family Medical/ Social History: Changes? None Patient Lives with: mother  MENTAL HEALTH: Mental Health Issues:   Depression and Anxiety-well controlled with her Wellbutrin with no counseling at this time.   Allergies: Allergies  Allergen Reactions  . Epiduo [Adapalene-Benzoyl Peroxide] Swelling    Pt had swelling in the face from medication    Current Medications:  Current Outpatient Medications  Medication Instructions  . Amphetamine Sulfate (EVEKEO) 10 mg, Oral, Every evening  . benzonatate (TESSALON PERLES) 100 mg, Oral, 3 times daily PRN  . buPROPion (WELLBUTRIN XL) 300 mg, Oral, Daily  . ibuprofen (ADVIL) 600 mg, Oral, Every 6 hours PRN  . loratadine (CLARITIN) 10 mg, Daily  . Multiple Vitamin (MULTIVITAMIN WITH MINERALS) TABS tablet 1 tablet, Daily  . predniSONE (STERAPRED UNI-PAK 21 TAB) 10 MG (21) TBPK tablet Use as directed  . Vyvanse 50 mg, Oral, Daily   Medication Side Effects: None  DIAGNOSES:    ICD-10-CM   1. ADHD (attention deficit hyperactivity disorder), combined type  F90.2 VYVANSE 50 MG capsule  2. Dysgraphia  R27.8   3. Learning difficulty  F81.9   4. patient counseled  Z79.899   5. Medication management  Z79.899   6. Goals of care, counseling/discussion  Z71.89   7. Adjustment disorder with anxiety  F43.22   8. History of depression  Z86.59    ASSESSMENT: Patient doing well at school this semester with taking 5 classes.  Has schedule classes for each day of the week to keep the same daily schedule. Doing well with disability services in place for accommodations and modifications for her learning along with attention needs. Extra help on campus is available if needed outside of her  accommodations. Good efficacy reported with her Vyvanse 50 mg daily with consideration of lowering the dose to 40 mg for the summer session. Using Evekeo 10 mg in the afternoon at 3:00 pm for continued coverage on school days. Has continued with Wellbutrin XL 300 mg daily with symptom control for her anxiety and depression. No reported side effects and no medication changes today.  PLAN/RECOMMENDATIONS:  Patient provided updates with family, health and home life. No changes reported with health care since last f/u on 12/03/2020.  Discussed disability services on campus with continued accommodations for classes to be successful at school with learning support.   Not currently getting counseling services. Ended therapy in January and patient is coping well with her anxiety and depression symptoms.     Discussed daily routine and schedule for consistency. Patient has figured out the best way to be consistent is to attend classes every day for her to maintain a daily regimen.  Information regarding extra curricular activities with participation in 2 clubs on campus. Encouragement and support given for more social interaction with other students.   Encouraged more physical activity with weather getting warmer and dietary intake of healthy variety of foods.   Reviewed bedtime routine with use of Melatonin for initiation difficulties.Patient encouraged to take the melatonin 1 hour before getting into the bed.   Counseled medication pharmacokinetics, options, dosage, administration, desired effects, and possible side effects.   Vyvanse 50 mg daily for school days, # 30 with no RF's May consider decreasing the dose to 40 mg this summer Wellbutrin XL 300 mg daily, # 30 with 2 RF's RX for above e-scribed and sent to pharmacy on record  [facility] - [place], [place] - [address] [facility]. [address] [place] [address] Phone: [phone] Fax: [phone]  I  discussed the assessment and treatment plan with the patient. The patient/par was provided an opportunity to ask questions and all were answered. The patient agreed with the plan and demonstrated an understanding of the instructions.   I provided 23 minutes of non-face-to-face time during this encounter. Completed record review for 10 minutes prior to the virtual video visit.   NEXT APPOINTMENT:  06/01/2021  Return in about 3 months (around 06/03/2021) for f/u visit.  The patient/parent was advised to call back or seek an in-person evaluation if the symptoms worsen or if the condition fails to improve as anticipated.   Carron Curie, NP

## 2021-03-14 ENCOUNTER — Other Ambulatory Visit (HOSPITAL_COMMUNITY): Payer: Self-pay

## 2021-04-27 ENCOUNTER — Other Ambulatory Visit (HOSPITAL_COMMUNITY): Payer: Self-pay

## 2021-04-27 ENCOUNTER — Other Ambulatory Visit: Payer: Self-pay

## 2021-04-27 MED ORDER — LISDEXAMFETAMINE DIMESYLATE 60 MG PO CAPS
60.0000 mg | ORAL_CAPSULE | ORAL | 0 refills | Status: DC
  Filled 2021-04-27: qty 30, 30d supply, fill #0

## 2021-04-27 MED FILL — Bupropion HCl Tab ER 24HR 300 MG: ORAL | 30 days supply | Qty: 30 | Fill #0 | Status: AC

## 2021-04-27 NOTE — Telephone Encounter (Signed)
Vyvanse increased to 60 mg daily per patient's request, # 30 with no RF's.RX for above e-scribed and sent to pharmacy on record  [facility] [address] [place] [address] Phone: [phone] Fax: [phone]

## 2021-04-27 NOTE — Telephone Encounter (Signed)
Patient called in stating she would like an increase of her Vyvanse

## 2021-05-05 ENCOUNTER — Other Ambulatory Visit (HOSPITAL_COMMUNITY): Payer: Self-pay

## 2021-05-20 ENCOUNTER — Other Ambulatory Visit (HOSPITAL_COMMUNITY): Payer: Self-pay

## 2021-06-01 ENCOUNTER — Encounter: Payer: Self-pay | Admitting: Family

## 2021-06-01 ENCOUNTER — Telehealth (INDEPENDENT_AMBULATORY_CARE_PROVIDER_SITE_OTHER): Payer: 59 | Admitting: Family

## 2021-06-01 ENCOUNTER — Other Ambulatory Visit (HOSPITAL_COMMUNITY): Payer: Self-pay

## 2021-06-01 ENCOUNTER — Other Ambulatory Visit: Payer: Self-pay

## 2021-06-01 DIAGNOSIS — Z79899 Other long term (current) drug therapy: Secondary | ICD-10-CM

## 2021-06-01 DIAGNOSIS — F4322 Adjustment disorder with anxiety: Secondary | ICD-10-CM | POA: Diagnosis not present

## 2021-06-01 DIAGNOSIS — R278 Other lack of coordination: Secondary | ICD-10-CM

## 2021-06-01 DIAGNOSIS — F819 Developmental disorder of scholastic skills, unspecified: Secondary | ICD-10-CM | POA: Diagnosis not present

## 2021-06-01 DIAGNOSIS — Z8659 Personal history of other mental and behavioral disorders: Secondary | ICD-10-CM | POA: Diagnosis not present

## 2021-06-01 DIAGNOSIS — Z7189 Other specified counseling: Secondary | ICD-10-CM | POA: Diagnosis not present

## 2021-06-01 DIAGNOSIS — F902 Attention-deficit hyperactivity disorder, combined type: Secondary | ICD-10-CM | POA: Diagnosis not present

## 2021-06-01 MED ORDER — AMPHETAMINE SULFATE 10 MG PO TABS
1.0000 | ORAL_TABLET | Freq: Every evening | ORAL | 0 refills | Status: DC
  Filled 2021-06-01: qty 30, 30d supply, fill #0

## 2021-06-01 MED ORDER — BUPROPION HCL ER (XL) 300 MG PO TB24
300.0000 mg | ORAL_TABLET | Freq: Every day | ORAL | 2 refills | Status: DC
  Filled 2021-06-01: qty 30, 30d supply, fill #0
  Filled 2021-07-09 (×2): qty 30, 30d supply, fill #1
  Filled 2021-08-12: qty 30, 30d supply, fill #2

## 2021-06-01 MED ORDER — LISDEXAMFETAMINE DIMESYLATE 60 MG PO CAPS
60.0000 mg | ORAL_CAPSULE | ORAL | 0 refills | Status: DC
  Filled 2021-06-01: qty 30, 30d supply, fill #0

## 2021-06-01 NOTE — Progress Notes (Signed)
[facility] [address] [place] [place] [address] Dept: [phone] Dept Fax: [phone]  Medication Check visit via Virtual Video   Patient ID:  Michelle Blevins  female DOB: [birth year]   20 y.o.   MRN: [id number]   DATE:06/01/21  PCP: Deeann Saint, MD  Virtual Visit via Video Note  I connected with  Michelle Blevins on 06/01/21 at  8:00 AM EDT by a video enabled telemedicine application and verified that I am speaking with the correct person using two identifiers. Patient/Parent Location: at home   I discussed the limitations, risks, security and privacy concerns of performing an evaluation and management service by telephone and the availability of in person appointments. I also discussed with the parents that there may be a patient responsible charge related to this service. The parents expressed understanding and agreed to proceed.  Provider: Carron Curie, NP  Location: private work location.  HPI/CURRENT STATUS: Michelle Blevins is here for medication management of the psychoactive medications for ADHD and review of educational and behavioral concerns.   Michelle Blevins currently taking Vyvanse 60 mg daily, which is working well. Takes medication at 7:00 am. Medication tends to wear off around early afternoon. Michelle Blevins is able to focus through school work/homework.   Michelle Blevins is eating well (eating breakfast, lunch and dinner). Eating well with no issues at this time.   Sleeping well (getting at least 6 hours or more each night), sleeping through the night. No problems  EDUCATION: School: [employer] Year/Grade:  college   Performance/ Grades: average Services: Other: Disability Services  Activities/ Exercise: walking on campus.   Driving now and working on hours for applying for her license.   Screen time: (phone, tablet, TV, computer): computer for learning needs, TV, movies and games.   MEDICAL  HISTORY: Individual Medical History/ Review of Systems: None reported recently.   Family Medical/ Social History: Changes? None Patient Lives with: mother  MENTAL HEALTH: Mental Health Issues:   Depression and Anxiety-Wellbutrin XL 300 mg daily. To restart counseling with the same counselor and has appt scheduled.   Allergies: Allergies  Allergen Reactions   Epiduo [Adapalene-Benzoyl Peroxide] Swelling    Pt had swelling in the face from medication    Current Medications:  Current Outpatient Medications  Medication Instructions   Amphetamine Sulfate (EVEKEO) 10 MG TABS 1 tablet, Oral, Every evening   buPROPion (WELLBUTRIN XL) 300 mg, Oral, Daily   ibuprofen (ADVIL) 600 mg, Oral, Every 6 hours PRN   lisdexamfetamine (VYVANSE) 60 mg, Oral, BH-each morning   loratadine (CLARITIN) 10 mg, Daily   predniSONE (STERAPRED UNI-PAK 21 TAB) 10 MG (21) TBPK tablet Use as directed   Medication Side Effects: None  DIAGNOSES:    ICD-10-CM   1. ADHD (attention deficit hyperactivity disorder), combined type  F90.2 Amphetamine Sulfate (EVEKEO) 10 MG TABS    2. Dysgraphia  R27.8     3. Learning difficulty  F81.9     4. patient counseled  Z79.899     5. Adjustment disorder with anxiety  F43.22     6. History of depression  Z86.59     7. Medication management  Z79.899     8. Goals of care, counseling/discussion  Z71.89      ASSESSMENT: Patient academically performing well with disability services on campus at [employer]. Is taking summer classes with some in-person 2 days a week with the other 2 classes online. Recently Michelle Blevins adjusted her Vyvanse to  60 mg daily with better efficacy during the school day and into the evening time. Has not been using the Evekeo and needs a RF for the evening time. Has continued with Wellbutrin XL 300 mg daily with positive reports of symptom control for her anxiety and depression. Some relationship issues with mother that are ongoing were briefly expressed and to  restart counseling. Applied to work part-time this summer at a [employer]. No recent changes reported for sleeping, eating or health in the past 3 months. To plan on restarting her Evekeo in the afternoon on a daily basis along with maintaining her Vyvanse 60 mg in the morning. Wellbutrin to stay the same with no changes. F/u in 3 months for routine visit. Encouraged to f/u with email in 3 weeks if afternoon Evekeo dose has not been effective with Vyvanse morning dose.   PLAN/RECOMMENDATIONS:  Patient provided updates with academic progress at [employer]. Taking summer classes and looking to enroll in the fall for full-time status with classes.   Disability services in place at school for needed learning support. Patient using the services needed for academic support as needed.   Applying for a summer job to work at a [employer]. Discussed structure, routine, and responsibility along with financial stake with having some extra money.   Discussed recent issues with parent relationship and need to restart counseling. Patient called to schedule appt for her to restart counseling services in the next few weeks. Support and discussion had regarding ongoing issues.  Encouraged sleep hygiene and bedtime routine nightly. Supported pm activities that are non-stimulating and turning off all screens 1 hour before bedtime. .   Counseled medication pharmacokinetics, options, dosage, administration, desired effects, and possible side effects.   Vyvase 60 mg daily, # 30 with no RF's Restart Evekeo 10 mg in the evening time, # 30 with no RF's Wellbutrin XL 300 mg daily, # 30 with 2 RF's.RX for above e-scribed and sent to pharmacy on record  [facility] [address] [place] [address] Phone: [phone] Fax: [phone]  I discussed the assessment and treatment plan with the patient. The patient was provided an opportunity to ask questions and all were answered. The  patient agreed with the plan and demonstrated an understanding of the instructions.   I provided 24 minutes of non-face-to-face time during this encounter. Completed record review for 10 minutes prior to the virtual video visit.   NEXT APPOINTMENT:  09/03/2021  Return in about 3 months (around 09/01/2021) for f/u visit.  The patient was advised to call back or seek an in-person evaluation if the symptoms worsen or if the condition fails to improve as anticipated.   Carron Curie, NP

## 2021-06-02 ENCOUNTER — Other Ambulatory Visit (HOSPITAL_COMMUNITY): Payer: Self-pay

## 2021-06-02 ENCOUNTER — Telehealth: Payer: Self-pay

## 2021-06-02 NOTE — Telephone Encounter (Signed)
Outcome Approvedtoday The request has been approved. The authorization is effective for a maximum of 12 fills from 06/02/2021 to 06/01/2022, as long as the member is enrolled in their current health plan. This has been approved for a max daily dosage of 4. A written notification letter will follow with additional details.

## 2021-06-03 DIAGNOSIS — F419 Anxiety disorder, unspecified: Secondary | ICD-10-CM | POA: Diagnosis not present

## 2021-06-03 DIAGNOSIS — F8181 Disorder of written expression: Secondary | ICD-10-CM | POA: Diagnosis not present

## 2021-06-03 DIAGNOSIS — F902 Attention-deficit hyperactivity disorder, combined type: Secondary | ICD-10-CM | POA: Diagnosis not present

## 2021-06-03 DIAGNOSIS — F812 Mathematics disorder: Secondary | ICD-10-CM | POA: Diagnosis not present

## 2021-06-17 DIAGNOSIS — F419 Anxiety disorder, unspecified: Secondary | ICD-10-CM | POA: Diagnosis not present

## 2021-06-17 DIAGNOSIS — F8181 Disorder of written expression: Secondary | ICD-10-CM | POA: Diagnosis not present

## 2021-06-17 DIAGNOSIS — F902 Attention-deficit hyperactivity disorder, combined type: Secondary | ICD-10-CM | POA: Diagnosis not present

## 2021-06-17 DIAGNOSIS — F812 Mathematics disorder: Secondary | ICD-10-CM | POA: Diagnosis not present

## 2021-07-09 ENCOUNTER — Other Ambulatory Visit (HOSPITAL_COMMUNITY): Payer: Self-pay

## 2021-08-13 ENCOUNTER — Other Ambulatory Visit (HOSPITAL_COMMUNITY): Payer: Self-pay

## 2021-09-03 ENCOUNTER — Institutional Professional Consult (permissible substitution): Payer: 59 | Admitting: Family

## 2021-09-03 ENCOUNTER — Telehealth: Payer: Self-pay

## 2021-09-03 NOTE — Telephone Encounter (Signed)
Called patient and ask if she was on her way to 9/22 appt with DPL she informed she wasn't and asked could she do virtual. Informed her that we couldn't do to her not being seen in the office since 05/2020. Rescheduled for 10/24, let patient know there will be a 50$ no show fee

## 2021-09-09 ENCOUNTER — Other Ambulatory Visit (HOSPITAL_COMMUNITY): Payer: Self-pay

## 2021-09-09 ENCOUNTER — Other Ambulatory Visit: Payer: Self-pay | Admitting: Family

## 2021-09-09 MED ORDER — BUPROPION HCL ER (XL) 300 MG PO TB24
300.0000 mg | ORAL_TABLET | Freq: Every day | ORAL | 2 refills | Status: DC
  Filled 2021-09-09: qty 30, 30d supply, fill #0
  Filled 2021-10-13: qty 30, 30d supply, fill #1
  Filled 2021-11-10: qty 30, 30d supply, fill #2

## 2021-09-09 NOTE — Telephone Encounter (Signed)
Wellbutrin XL 300 mg daily, # 30 with 2 RF's.RX for above e-scribed and sent to pharmacy on record  [facility] [address] [place] [address] Phone: [phone] Fax: [phone]

## 2021-09-11 ENCOUNTER — Ambulatory Visit: Payer: 59

## 2021-09-11 ENCOUNTER — Ambulatory Visit: Payer: 59 | Attending: Internal Medicine

## 2021-09-11 ENCOUNTER — Other Ambulatory Visit (HOSPITAL_BASED_OUTPATIENT_CLINIC_OR_DEPARTMENT_OTHER): Payer: Self-pay

## 2021-09-11 DIAGNOSIS — Z23 Encounter for immunization: Secondary | ICD-10-CM

## 2021-09-11 MED ORDER — PFIZER COVID-19 VAC BIVALENT 30 MCG/0.3ML IM SUSP
INTRAMUSCULAR | 0 refills | Status: DC
  Filled 2021-09-11: qty 0.3, 1d supply, fill #0

## 2021-09-11 NOTE — Progress Notes (Signed)
   Covid-19 Vaccination Clinic  Name:  Michelle Blevins    MRN: [id number] DOB: Nov 25, 2001  09/11/2021  Michelle Blevins was observed post Covid-19 immunization for 15 minutes without incident. She was provided with Vaccine Information Sheet and instruction to access the V-Safe system.   Michelle Blevins was instructed to call 911 with any severe reactions post vaccine: Difficulty breathing  Swelling of face and throat  A fast heartbeat  A bad rash all over body  Dizziness and weakness

## 2021-10-05 ENCOUNTER — Other Ambulatory Visit: Payer: Self-pay

## 2021-10-05 ENCOUNTER — Other Ambulatory Visit (HOSPITAL_COMMUNITY): Payer: Self-pay

## 2021-10-05 ENCOUNTER — Ambulatory Visit (INDEPENDENT_AMBULATORY_CARE_PROVIDER_SITE_OTHER): Payer: 59 | Admitting: Family

## 2021-10-05 ENCOUNTER — Encounter: Payer: Self-pay | Admitting: Family

## 2021-10-05 VITALS — BP 108/68 | HR 76 | Resp 16 | Ht 64.0 in | Wt 248.0 lb

## 2021-10-05 DIAGNOSIS — Z8659 Personal history of other mental and behavioral disorders: Secondary | ICD-10-CM

## 2021-10-05 DIAGNOSIS — Z7189 Other specified counseling: Secondary | ICD-10-CM | POA: Diagnosis not present

## 2021-10-05 DIAGNOSIS — F419 Anxiety disorder, unspecified: Secondary | ICD-10-CM

## 2021-10-05 DIAGNOSIS — F819 Developmental disorder of scholastic skills, unspecified: Secondary | ICD-10-CM

## 2021-10-05 DIAGNOSIS — Z79899 Other long term (current) drug therapy: Secondary | ICD-10-CM | POA: Diagnosis not present

## 2021-10-05 DIAGNOSIS — R278 Other lack of coordination: Secondary | ICD-10-CM

## 2021-10-05 DIAGNOSIS — F902 Attention-deficit hyperactivity disorder, combined type: Secondary | ICD-10-CM | POA: Diagnosis not present

## 2021-10-05 MED ORDER — LISDEXAMFETAMINE DIMESYLATE 60 MG PO CAPS
60.0000 mg | ORAL_CAPSULE | ORAL | 0 refills | Status: DC
  Filled 2021-10-05: qty 30, 30d supply, fill #0

## 2021-10-05 MED ORDER — AMPHETAMINE SULFATE 10 MG PO TABS
1.0000 | ORAL_TABLET | Freq: Every evening | ORAL | 0 refills | Status: DC
  Filled 2021-10-05: qty 30, 30d supply, fill #0

## 2021-10-05 NOTE — Progress Notes (Signed)
[facility] [address] [place] [address] Dept: [phone] Dept Fax: [phone] Loc: [phone] Loc Fax: [phone]  Medication Check  Patient ID: Lady Saucier, female  DOB: 02-May-2001, 20 y.o.  MRN: [id number]  Date of Evaluation: 10/05/2021 PCP: Deeann Saint, MD  Accompanied by:  Self Patient Lives with: mother  HISTORY/CURRENT STATUS: HPI Patient here by herself today and interactive with provider. Very appropriate with provider. Aurie is doing well at school with 3 classes this semester with above average grades. No issues with completion of work or focusing. Has been more organized and responsible with school related to success. Has continued with medications for anxiety, depression and anxiety with no current issues.   EDUCATION: School: [employer], [employer] and Spanish  In person for all 3 classes Year/Grade:  college   Homework Hours Spent: depending on the class requirements.  Performance/ Grades: above average Services: Other: Disability Services in place Activities/ Exercise: intermittently-walking on campus, at home walking the dog, machine at home. Still participating in art projects and calligraphy.   MEDICAL HISTORY: Appetite: Good  MVI/Other: None   Getting a variety of foods each day Enjoys cooking  Sleep: Bedtime: 10-11:00 pm  Awakens: 7-7:30 am   Concerns: Initiation/Maintenance/Other: None  Individual Medical History/ Review of Systems: Changes? :None  Allergies: Epiduo [adapalene-benzoyl peroxide]  Current Medications:  Current Outpatient Medications  Medication Instructions   Amphetamine Sulfate (EVEKEO) 10 MG TABS 1 tablet, Oral, Every evening   buPROPion (WELLBUTRIN XL) 300 mg, Oral, Daily   COVID-19 mRNA bivalent vaccine, Pfizer, (PFIZER COVID-19 VAC BIVALENT) injection Intramuscular    ibuprofen (ADVIL) 600 mg, Oral, Every 6 hours PRN   lisdexamfetamine (VYVANSE) 60 mg, Oral, BH-each morning   loratadine (CLARITIN) 10 mg, Daily   Medication Side Effects: None  Family Medical/ Social History: Changes? None reported  MENTAL HEALTH: Mental Health Issues: Depression and Anxiety-Wellbutrin XL 300 mg daily and counseling as needed.   PHYSICAL EXAM; Vitals:  Vitals:   10/05/21 0941  BP: 108/68  Pulse: 76  Resp: 16  Weight: 248 lb (112.5 kg)  Height: 5\' 4"  (1.626 m)    General Physical Exam: Unchanged from previous exam, date:06/03/2021 Changed:None  DIAGNOSES:    ICD-10-CM   1. ADHD (attention deficit hyperactivity disorder), combined type  F90.2 Amphetamine Sulfate (EVEKEO) 10 MG TABS    2. Dysgraphia  R27.8     3. Learning difficulty  F81.9     4. patient counseled  Z79.899     5. Anxiousness  F41.9     6. History of depression  Z86.59     7. Medication management  Z79.899     8. Goals of care, counseling/discussion  Z71.89      ASSESSMENT: Lania is a 20 year old female with a history of ADHD, Anxiety, Depression and learning difficulties. She has been well maintained on her current medications regimen of Vyvanse 60 mg daily, Evekeo 10 mg in the afternoon, and Wellbutrin XL 300 mg with no side effects. Medication have been effective for both day time and evening with school work. Attending [employer] with 3 classes this semester. Passing all classes with above average grades. Maintaining disability services at school for academic and attention needs. Traveling over the holidays for a few weeks to visit family abroad. Getting some physical activity with walking on campus and at home. Still participating with artwork and calligraphy as a hobby. Eating  with no changes and no physical changes. No recent issues with health or need for f/u care. Sleeping well with no reported issues. Will continue with current medications and dosing. Will reassess in 3 months.    RECOMMENDATIONS:  Updates for school, learning and academic progress reviewed with patient today.   Discussed ongoing services at school through disability for learning support when needed.  Exercising and health habits reviewed with no changes reported. Eating with no changes over the past 3 months. Walking with dog and on campus for activity daily. Encouraged a variety of foods and more water daily.  Discussed hobbies and recent trip with mother. To travel [place] in December for her birthday during the holidays to visit family for a few weeks. Patient excited to see family and provided support to her today.  Reviewed sleep schedule and sleep hygiene with patient. Discussed sleep and wake cycle with better adherence with school. Getting enough sleep with no issues reported.   Vyvanse 60 mg daily, # 30 with no RF's Evekeo 10 mg daily in the evening, # 30 with no RF's.RX for above e-scribed and sent to pharmacy on record  [facility] [address] [place] [address] Phone: [phone] Fax: [phone]  NEXT APPOINTMENT: Return in about 3 months (around 01/05/2022) for f/u visit.  Carron Curie, NP

## 2021-10-13 ENCOUNTER — Other Ambulatory Visit (HOSPITAL_COMMUNITY): Payer: Self-pay

## 2021-11-02 ENCOUNTER — Other Ambulatory Visit: Payer: Self-pay

## 2021-11-02 ENCOUNTER — Other Ambulatory Visit (HOSPITAL_COMMUNITY): Payer: Self-pay

## 2021-11-02 DIAGNOSIS — F902 Attention-deficit hyperactivity disorder, combined type: Secondary | ICD-10-CM

## 2021-11-02 MED ORDER — AMPHETAMINE SULFATE 10 MG PO TABS
1.0000 | ORAL_TABLET | Freq: Every evening | ORAL | 0 refills | Status: DC
  Filled 2021-11-02 (×2): qty 30, 30d supply, fill #0

## 2021-11-02 MED ORDER — LISDEXAMFETAMINE DIMESYLATE 60 MG PO CAPS
60.0000 mg | ORAL_CAPSULE | ORAL | 0 refills | Status: DC
  Filled 2021-11-02: qty 30, 30d supply, fill #0

## 2021-11-02 NOTE — Telephone Encounter (Signed)
Vyvanse 60 mg daily, # 30 with no RF"s and Evekeo 10 mg in the evening # 30 with no RF's. RX for above e-scribed and sent to pharmacy on record  [facility] [address] [place] [address] Phone: [phone] Fax: [phone]

## 2021-11-10 ENCOUNTER — Other Ambulatory Visit (HOSPITAL_COMMUNITY): Payer: Self-pay

## 2021-12-20 ENCOUNTER — Other Ambulatory Visit: Payer: Self-pay | Admitting: Family

## 2021-12-21 ENCOUNTER — Other Ambulatory Visit (HOSPITAL_COMMUNITY): Payer: Self-pay

## 2021-12-21 MED ORDER — BUPROPION HCL ER (XL) 300 MG PO TB24
300.0000 mg | ORAL_TABLET | Freq: Every day | ORAL | 2 refills | Status: DC
  Filled 2021-12-21: qty 30, 30d supply, fill #0
  Filled 2022-01-29: qty 30, 30d supply, fill #1
  Filled 2022-02-26: qty 30, 30d supply, fill #2

## 2021-12-21 NOTE — Telephone Encounter (Signed)
Wellbutrin XL 300 mg daily, # 30 with 2 RF's.RX for above e-scribed and sent to pharmacy on record  [facility] [address] [place] [address] Phone: [phone] Fax: [phone]     

## 2022-01-11 ENCOUNTER — Encounter: Payer: Self-pay | Admitting: Family

## 2022-01-11 ENCOUNTER — Telehealth (INDEPENDENT_AMBULATORY_CARE_PROVIDER_SITE_OTHER): Payer: 59 | Admitting: Family

## 2022-01-11 ENCOUNTER — Other Ambulatory Visit (HOSPITAL_COMMUNITY): Payer: Self-pay

## 2022-01-11 ENCOUNTER — Other Ambulatory Visit: Payer: Self-pay

## 2022-01-11 DIAGNOSIS — F902 Attention-deficit hyperactivity disorder, combined type: Secondary | ICD-10-CM

## 2022-01-11 DIAGNOSIS — Z7189 Other specified counseling: Secondary | ICD-10-CM | POA: Diagnosis not present

## 2022-01-11 DIAGNOSIS — F4322 Adjustment disorder with anxiety: Secondary | ICD-10-CM | POA: Diagnosis not present

## 2022-01-11 DIAGNOSIS — F819 Developmental disorder of scholastic skills, unspecified: Secondary | ICD-10-CM

## 2022-01-11 DIAGNOSIS — R278 Other lack of coordination: Secondary | ICD-10-CM

## 2022-01-11 DIAGNOSIS — Z79899 Other long term (current) drug therapy: Secondary | ICD-10-CM

## 2022-01-11 DIAGNOSIS — Z8659 Personal history of other mental and behavioral disorders: Secondary | ICD-10-CM

## 2022-01-11 DIAGNOSIS — Z719 Counseling, unspecified: Secondary | ICD-10-CM | POA: Diagnosis not present

## 2022-01-11 MED ORDER — LISDEXAMFETAMINE DIMESYLATE 60 MG PO CAPS
60.0000 mg | ORAL_CAPSULE | ORAL | 0 refills | Status: DC
  Filled 2022-01-11: qty 30, 30d supply, fill #0

## 2022-01-11 NOTE — Progress Notes (Signed)
[facility] [address] [place] [place] [address] Dept: [phone] Dept Fax: [phone]  Medication Check visit via Virtual Video   Patient ID:  Michelle Blevins  female DOB: [birth year]   21 y.o.   MRN: [id number]   DATE:01/11/22  PCP: Deeann Saint, MD  Virtual Visit via Video Note  I connected with  Lady Saucier on 01/11/22 at  8:00 AM EST by a video enabled telemedicine application and verified that I am speaking with the correct person using two identifiers. Patient/Parent Location: at home   I discussed the limitations, risks, security and privacy concerns of performing an evaluation and management service by telephone and the availability of in person appointments. I also discussed with the parents that there may be a patient responsible charge related to this service. The parents expressed understanding and agreed to proceed.  Provider: Carron Curie, NP  Location: private work location  HPI/CURRENT STATUS: Shatera Rennert is here for medication management of the psychoactive medications for ADHD and review of educational and behavioral concerns.   Kirsty currently taking Vyvanse 60 mg daily & Wellbutrin XL 300 mg, which is working well. Takes medication in the morning. Medication tends to wear off around late afternoon and taking Evekeo 4:00 pm, PRN. Oktober is able to focus through school & homework.   Kenndra is eating well (eating breakfast, lunch and dinner). Juleah does not have appetite suppression  Sleeping well (goes to bed at 11:00 pm wakes at 6:30-7:00 am), sleeping through the night. Oveta does not have delayed sleep onset  EDUCATION: School: [employer] and Psychology Year/Grade:  college classes   Performance/ Grades: above average Services: Other: disability services Activities/ Exercise: intermittently-walking on campus and at home arts/crafts, writing.    MEDICAL HISTORY: Individual Medical History/ Review of Systems: None  Has been healthy with no visits to the PCP. WCC due yearly, not scheduled yet with GYN.   Family Medical/ Social History: Changes? None Patient Lives with: mother  MENTAL HEALTH: Mental Health Issues:   Depression and Anxiety-Wellbutrin XL 300 mg daily with good symptom control.  Counseling on an as needed basis.   Allergies: Allergies  Allergen Reactions   Epiduo [Adapalene-Benzoyl Peroxide] Swelling    Pt had swelling in the face from medication   Current Medications:  Current Outpatient Medications  Medication Instructions   Amphetamine Sulfate (EVEKEO) 10 MG TABS 1 tablet, Oral, Every evening   buPROPion (WELLBUTRIN XL) 300 mg, Oral, Daily   COVID-19 mRNA bivalent vaccine, Pfizer, (PFIZER COVID-19 VAC BIVALENT) injection Intramuscular   ibuprofen (ADVIL) 600 mg, Oral, Every 6 hours PRN   lisdexamfetamine (VYVANSE) 60 mg, Oral, BH-each morning   loratadine (CLARITIN) 10 mg, Daily   Medication Side Effects: None  DIAGNOSES:    ICD-10-CM   1. ADHD (attention deficit hyperactivity disorder), combined type  F90.2     2. Dysgraphia  R27.8     3. Learning difficulty  F81.9     4. patient counseled  Z79.899     5. Adjustment disorder with anxiety  F43.22     6. History of depression  Z86.59     7. Medication management  Z79.899     8. Patient counseled  Z71.9     9. Goals of care, counseling/discussion  Z71.89      ASSESSMENT:      Michelle Blevins is a 21 year old female with a history of ADHD, Anxiety, Depression, and learning  difficulties. Has been maintained on Wellbutrin XL 300 mg and Vyvanse 60 mg daily with prn use of Evekeo 10 mg in the afternoon. Good efficacy with no side effects reported. Academically doing well with taking 3 classes each semester with only 4 classes left for her degree at [employer]. Getting disability services on campus for her learning and attention needs. No changes in health in the  past 3 months. Sleeping with no concerns or issues. Eating has been about the same with no changes reported. Getting in some activity with walking and participating with crafts in her spare time. Medications and dosing to remain the same with no changes needed today.   PLAN/RECOMMENDATIONS:  Updates with school, classes, schedule for the semester with 3 classes.   Disability services has continued with on campus assistance at [facility] [employer]. Support and help provided at needed.  Discussed transfer after her last 4 classes to local university [employer]) for psychology. Support and encouragement given.   Encouraged more physical activity on a regular basis with walking at least 20-30 mins/day.   Supported healthy variety of foods with each meal or snack. Encouraged increasing daily water intake.   Healthcare with routine f/u yearly along with GYN starting at 21 years old discussed today.  Sleep schedule with daily routine reviewed with patient. No current issues and getting plenty of sleep each night.   Counseled medication pharmacokinetics, options, dosage, administration, desired effects, and possible side effects.   Vyvanse 60 mg daily, # 30 with no RF's Wellbutrin XL 300 mg daily, no Rx today Evekeo 10 mg PRN in the evening, no Rx today RX for above e-scribed and sent to pharmacy on record  [facility] [address] [place] [address] Phone: [phone] Fax: [phone]  I discussed the assessment and treatment plan with the patient. The patient was provided an opportunity to ask questions and all were answered. The patient agreed with the plan and demonstrated an understanding of the instructions.   NEXT APPOINTMENT:  Visit date not found-f/u visit  Telehealth OK  The patient was advised to call back or seek an in-person evaluation if the symptoms worsen or if the condition fails to improve as anticipated.   Carron Curie, NP

## 2022-01-21 ENCOUNTER — Telehealth: Payer: Self-pay | Admitting: Family Medicine

## 2022-01-21 NOTE — Telephone Encounter (Signed)
Patient's mother called to see if she could be seen tomorrow. I let her know that I would have to send a message back to see if she could be seen in office as it has been three years since she has been seen by Dr.Banks, making her a new patient.    Good callback number is [phone]     Please advise

## 2022-01-22 ENCOUNTER — Telehealth (INDEPENDENT_AMBULATORY_CARE_PROVIDER_SITE_OTHER): Payer: 59 | Admitting: Family Medicine

## 2022-01-22 ENCOUNTER — Other Ambulatory Visit (HOSPITAL_COMMUNITY): Payer: Self-pay

## 2022-01-22 DIAGNOSIS — J310 Chronic rhinitis: Secondary | ICD-10-CM

## 2022-01-22 DIAGNOSIS — J329 Chronic sinusitis, unspecified: Secondary | ICD-10-CM | POA: Diagnosis not present

## 2022-01-22 DIAGNOSIS — R0982 Postnasal drip: Secondary | ICD-10-CM

## 2022-01-22 MED ORDER — AMOXICILLIN 500 MG PO CAPS
500.0000 mg | ORAL_CAPSULE | Freq: Two times a day (BID) | ORAL | 0 refills | Status: AC
  Filled 2022-01-22: qty 14, 7d supply, fill #0

## 2022-01-22 NOTE — Telephone Encounter (Signed)
Pt scheduled for VV with Dr Volanda Napoleon today at 3:30

## 2022-01-22 NOTE — Progress Notes (Signed)
Virtual Visit via Telephone Note  I connected with Michelle Blevins on 01/22/22 at  3:30 PM EST by telephone and verified that I am speaking with the correct person using two identifiers.   I discussed the limitations, risks, security and privacy concerns of performing an evaluation and management service by telephone and the availability of in person appointments. I also discussed with the patient that there may be a patient responsible charge related to this service. The patient expressed understanding and agreed to proceed.  Location patient: home Location provider: work or home office Participants present for the call: patient, provider Patient did not have a visit in the prior 7 days to address this/these issue(s).  Chief Complaint  Patient presents with   Sick Visit    Pt reports cough/congestion x1-2 weeks now. Has taken COVID test recently and reports negative result. Reports yellow-creamy like phlegm and clearish- yellow mucous. Reports slight SOB but denies CP. Highest temp was 99. Has tried OTC muscous relief and cough medicine. Also reports travelling to [place] in beginning of January 12/2021.     History of Present Illness: Pt sick x a few wks.  Pt got sick while in the [place] (Dec 2022-Jan 2023).  Symptoms improved slightly then returned. Pt had a negative COVID test a wk ago. Pt with productive cough, congestion, rhinorrhea, SOB and wheezing when going to bed, post nasal drainage, temp 99 F, pressure in nose. ST-now resolved Ear pressure has resolved Denies chills, n/v Tried OTC cough and cold meds.   Observations/Objective: Patient sounds cheerful and well on the phone. I do not appreciate any SOB. Speech and thought processing are grossly intact. Patient reported vitals:  Assessment and Plan: Rhinosinusitis  -Symptoms likely started as a viral URI with progression into secondary bacterial infection -Continue OTC cough/cold medication, antihistamines, gargling  with warm salt water Chloraseptic spray, steam from shower, rest, hydration, Tylenol or NSAIDs as needed -Discussed using saline nasal rinse or Flonase nasal spray -We will start amoxicillin -Given precautions - Plan: amoxicillin (AMOXIL) 500 MG tablet  Postnasal drip -Likely contributing to cough, shortness of breath and wheezing -Flonase nasal spray or OTC antihistamine   Follow Up Instructions:  F/u prn  99441 5-10 99442 11-20 9443 21-30 I did not refer this patient for an OV in the next 24 hours for this/these issue(s).  I discussed the assessment and treatment plan with the patient. The patient was provided an opportunity to ask questions and all were answered. The patient agreed with the plan and demonstrated an understanding of the instructions.   The patient was advised to call back or seek an in-person evaluation if the symptoms worsen or if the condition fails to improve as anticipated.  I provided 7:30 minutes of non-face-to-face time during this encounter.  Billie Ruddy, MD

## 2022-01-29 ENCOUNTER — Other Ambulatory Visit (HOSPITAL_COMMUNITY): Payer: Self-pay

## 2022-02-26 ENCOUNTER — Other Ambulatory Visit (HOSPITAL_COMMUNITY): Payer: Self-pay

## 2022-03-27 ENCOUNTER — Other Ambulatory Visit: Payer: Self-pay | Admitting: Family

## 2022-03-29 ENCOUNTER — Other Ambulatory Visit (HOSPITAL_COMMUNITY): Payer: Self-pay

## 2022-03-29 MED ORDER — BUPROPION HCL ER (XL) 300 MG PO TB24
300.0000 mg | ORAL_TABLET | Freq: Every day | ORAL | 2 refills | Status: DC
  Filled 2022-03-29: qty 30, 30d supply, fill #0
  Filled 2022-04-26: qty 30, 30d supply, fill #1
  Filled 2022-06-08: qty 30, 30d supply, fill #2

## 2022-03-29 NOTE — Telephone Encounter (Signed)
Wellbutrin XL 300 mg daily # 30 with 2 RF's.RX for above e-scribed and sent to pharmacy on record ? ?[facility] ?[address] ?[place] [address] ?Phone: [phone] Fax: [phone] ? ? ? ?

## 2022-04-26 ENCOUNTER — Other Ambulatory Visit (HOSPITAL_COMMUNITY): Payer: Self-pay

## 2022-04-26 ENCOUNTER — Telehealth (INDEPENDENT_AMBULATORY_CARE_PROVIDER_SITE_OTHER): Payer: 59 | Admitting: Family

## 2022-04-26 ENCOUNTER — Encounter: Payer: Self-pay | Admitting: Family

## 2022-04-26 DIAGNOSIS — F902 Attention-deficit hyperactivity disorder, combined type: Secondary | ICD-10-CM

## 2022-04-26 DIAGNOSIS — F819 Developmental disorder of scholastic skills, unspecified: Secondary | ICD-10-CM

## 2022-04-26 DIAGNOSIS — R278 Other lack of coordination: Secondary | ICD-10-CM

## 2022-04-26 DIAGNOSIS — Z7189 Other specified counseling: Secondary | ICD-10-CM

## 2022-04-26 DIAGNOSIS — Z79899 Other long term (current) drug therapy: Secondary | ICD-10-CM

## 2022-04-26 MED ORDER — LISDEXAMFETAMINE DIMESYLATE 60 MG PO CAPS
60.0000 mg | ORAL_CAPSULE | ORAL | 0 refills | Status: DC
Start: 2022-04-26 — End: 2022-07-12
  Filled 2022-04-26: qty 30, 30d supply, fill #0

## 2022-04-26 NOTE — Progress Notes (Signed)
?[facility] ?[facility] ?[address] ?[place] [address] ?Dept: [phone] ?Dept Fax: [phone] ? ?Medication Check visit via Virtual Video  ? ?Patient ID:  Michelle Blevins  female DOB: February 15, 2001   20 y.o.   MRN: [id number]  ? ?DATE:04/26/22 ? ?PCP: Deeann Saint, MD ? ?Virtual Visit via Video Note ? ?I connected with  Michelle Blevins  on 04/26/22 at  8:00 AM EDT by a video enabled telemedicine application and verified that I am speaking with the correct person using two identifiers. Patient/Parent Location: at home ?  ?I discussed the limitations, risks, security and privacy concerns of performing an evaluation and management service by telephone and the availability of in person appointments. I also discussed with the parents that there may be a patient responsible charge related to this service. The parents expressed understanding and agreed to proceed. ? ?Provider: Carron Curie, NP  Location: private work location ? ?HPI/CURRENT STATUS: ?Michelle Blevins is here for medication management of the psychoactive medications for ADHD and review of educational and behavioral concerns.  ? ?Michelle Blevins currently taking Vyvnase and Wellbutrin XL, which is working well. Takes medication daily in the morning. . Medication tends to wear off around evening time and will use her Evekeo for home work as needed. Michelle Blevins is able to focus through school & homework.  ? ?Michelle Blevins is eating well (eating breakfast, lunch and dinner). Michelle Blevins does not have appetite suppression and no MVI daily. Eating healthier variety of foods ? ?Sleeping well (getting enough sleep each night, about 7 hours), sleeping through the night. Michelle Blevins does not have delayed sleep onset ? ?EDUCATION: ?School: [employer]  ?Summer classes ?Year/Grade:  college   ?Performance/ Grades: above average ?Services: Other: Disability Services ? ?Activities/ Exercise:  walking the dog daily and  brings the dog to the par,  ? ?MEDICAL HISTORY: ?Individual Medical History/ Review of Systems: Yes, dental cleaning for routine care.  Has been healthy with no visits to the PCP. WCC due yearly.  ? ?Family Medical/ Social History: Changes? Yes, Friday nights eery 2 weeks.  ?Patient Lives with: mother and dog ? ?MENTAL HEALTH: ?Mental Health Issues:   Depression and Anxiety with Wellbutrin XL 300 mg daily with good symptoms, no current counseling and only for PRN visits now. ? ?Allergies: ?Allergies  ?Allergen Reactions  ? Epiduo [Adapalene-Benzoyl Peroxide] Swelling  ?  Pt had swelling in the face from medication  ? ?Current Medications:  ?Current Outpatient Medications on File Prior to Visit  ?Medication Sig Dispense Refill  ? Amphetamine Sulfate (EVEKEO) 10 MG TABS Take 1 tablet by mouth every evening. 30 tablet 0  ? buPROPion (WELLBUTRIN XL) 300 MG 24 hr tablet Take 1 tablet (300 mg total) by mouth daily. 30 tablet 2  ? loratadine (CLARITIN) 10 MG tablet Take 10 mg by mouth daily.    ? COVID-19 mRNA bivalent vaccine, Pfizer, (PFIZER COVID-19 VAC BIVALENT) injection Inject into the muscle. 0.3 mL 0  ? ibuprofen (ADVIL,MOTRIN) 600 MG tablet Take 1 tablet (600 mg total) by mouth every 6 (six) hours as needed. 30 tablet 0  ? ?No current facility-administered medications on file prior to visit.  ? ?Medication Side Effects: None ? ?DIAGNOSES:  ?  ICD-10-CM   ?1. ADHD (attention deficit hyperactivity disorder), combined type  F90.2   ?  ?2. Dysgraphia  R27.8   ?  ?3. Learning difficulty  F81.9   ?  ?4. patient counseled  Z79.899   ?  ?  5. Medication management  Z79.899   ?  ?6. Goals of care, counseling/discussion  Z71.89   ?  ? ?ASSESSMENT:     ?Michelle Blevins is a 21 year old female with a history of ADHD, Anxiety, Depression and learning difficulties. She has been maintained on Vyvanse 60 mg daily, Evekeo 10 mg in the afternoon PRN for homework, and Wellbutrin XL 300 mg daily. Good efficacy reported with her medication  regimen and no side effects reported. Academically doing well with only a few classes left to graduate from [employer]. Michelle Blevins is looking to transfer to a 4 year school, [employer], to further her education. Has continued with disability services at school for learning support. No recent changes in health, eating or sleeping in the past 3 months. Getting more activity with walking the dog. No current counseling on a regular basis, but can schedule as needed. Medication regiment to continue with no changes today.  ? ?PLAN/RECOMMENDATIONS:  ?Updates for school and current progress with her programs with credits needed to graduate. ? ?Looking to transfer to a 4 year school, [employer], after completion of her last few classes at [employer]. ? ?Disability services has continued at [employer]. Discussed continuation of services at [employer] at [employer]. ? ?Michelle Blevins is considering a psychology major with transition to [employer], but unsure of specialty.  ? ?Supported getting more physical activity and walking the dog daily and bringing the dog to the park. ? ?Eating healthier food options and supported getting more water daily. ? ?Routine health care and follow ups suggested for good healthcare practice. ? ?Sleeping with continued regimen along with sleep hygiene discussed with patient.  ? ?Counseled medication pharmacokinetics, options, dosage, administration, desired effects, and possible side effects.   ?Vyvanse 60 mg daily, # 30 with no RF's ?Evekeo 10 mg in the evening PRN, no Rx today ?Wellbutrin XL 300 mg daily, no Rx today ?RX for above e-scribed and sent to pharmacy on record ? ?[facility] ?[address] ?[place] [address] ?Phone: [phone] Fax: [phone] ? ?I discussed the assessment and treatment plan with the patient. The patient was provided an opportunity to ask questions and all were answered. The patient agreed with the plan and demonstrated an understanding of the instructions. ?  ?NEXT APPOINTMENT:  ?Visit date  not found-f/u visit to be scheduled ?Telehealth OK ? ?The patient was advised to call back or seek an in-person evaluation if the symptoms worsen or if the condition fails to improve as anticipated. ? ? ?Carron Curie, NP ? ?

## 2022-06-08 ENCOUNTER — Other Ambulatory Visit (HOSPITAL_COMMUNITY): Payer: Self-pay

## 2022-07-11 ENCOUNTER — Other Ambulatory Visit: Payer: Self-pay | Admitting: Family

## 2022-07-11 DIAGNOSIS — F902 Attention-deficit hyperactivity disorder, combined type: Secondary | ICD-10-CM

## 2022-07-12 ENCOUNTER — Other Ambulatory Visit (HOSPITAL_COMMUNITY): Payer: Self-pay

## 2022-07-12 MED ORDER — AMPHETAMINE SULFATE 10 MG PO TABS
1.0000 | ORAL_TABLET | Freq: Every evening | ORAL | 0 refills | Status: DC
  Filled 2022-07-12: qty 30, 30d supply, fill #0

## 2022-07-12 MED ORDER — BUPROPION HCL ER (XL) 300 MG PO TB24
300.0000 mg | ORAL_TABLET | Freq: Every day | ORAL | 2 refills | Status: DC
  Filled 2022-07-12: qty 30, 30d supply, fill #0
  Filled 2022-08-09: qty 30, 30d supply, fill #1
  Filled 2022-09-10: qty 30, 30d supply, fill #2

## 2022-07-12 MED ORDER — LISDEXAMFETAMINE DIMESYLATE 60 MG PO CAPS
60.0000 mg | ORAL_CAPSULE | ORAL | 0 refills | Status: DC
Start: 2022-07-12 — End: 2022-09-14
  Filled 2022-07-12: qty 30, 30d supply, fill #0

## 2022-07-12 NOTE — Telephone Encounter (Signed)
Vyvanse 60 mg daily, # 30 with no RF's, Evekeo 10 mg in the evening, # 30 with no RF's and Wellbutrin XL 300 mg # 30 with 2 RF's.RX for above e-scribed and sent to pharmacy on record  [facility] [address] [place] [address] Phone: [phone] Fax: [phone]

## 2022-07-13 ENCOUNTER — Telehealth: Payer: Self-pay

## 2022-07-13 NOTE — Telephone Encounter (Signed)
Outcome Approvedtoday The request has been approved. The authorization is effective for a maximum of 12 fills from 07/13/2022 to 07/13/2023, as long as the member is enrolled in their current health plan. The request was approved with a quantity restriction. This has been approved for a max daily dosage of 4. A written notification letter will follow with additional details.

## 2022-07-26 ENCOUNTER — Telehealth (INDEPENDENT_AMBULATORY_CARE_PROVIDER_SITE_OTHER): Payer: 59 | Admitting: Family

## 2022-07-26 ENCOUNTER — Encounter: Payer: Self-pay | Admitting: Family

## 2022-07-26 DIAGNOSIS — F819 Developmental disorder of scholastic skills, unspecified: Secondary | ICD-10-CM | POA: Diagnosis not present

## 2022-07-26 DIAGNOSIS — F902 Attention-deficit hyperactivity disorder, combined type: Secondary | ICD-10-CM | POA: Diagnosis not present

## 2022-07-26 DIAGNOSIS — R278 Other lack of coordination: Secondary | ICD-10-CM

## 2022-07-26 DIAGNOSIS — Z79899 Other long term (current) drug therapy: Secondary | ICD-10-CM

## 2022-07-26 NOTE — Progress Notes (Signed)
[facility] [address] [place] [address] Dept: [phone] Dept Fax: [phone]  Medication Check visit via Virtual Video   Patient ID:  Michelle Blevins  female DOB: 04-19-2001   21 y.o.   MRN: [id number]   DATE:07/26/22  PCP: Deeann Saint, MD  Virtual Visit via Video Note  I connected with  Michelle Blevins on 07/26/22 at  9:00 AM EDT by a video enabled telemedicine application and verified that I am speaking with the correct person using two identifiers. Patient/Parent Location: at home   I discussed the limitations, risks, security and privacy concerns of performing an evaluation and management service by telephone and the availability of in person appointments. I also discussed with the parents that there may be a patient responsible charge related to this service. The parents expressed understanding and agreed to proceed.  Provider: Carron Curie, NP  Location: private work location  HPI/CURRENT STATUS: Michelle Blevins is here for medication management of the psychoactive medications for ADHD and review of educational and behavioral concerns.   Michelle Blevins currently taking her medication regimen,  which is working well. Takes medication daily as recommended. Medication tends to wear off around evening time for her stimulant. Michelle Blevins is able to focus through school & homework.   Michelle Blevins is eating well (eating breakfast, lunch and dinner). Michelle Blevins does not have appetite suppression and eating during the day.   Sleeping well (goes to bed at 2200-2400 wakes at 0600), sleeping through the night. Michelle Blevins does not have delayed sleep onset and no concerns.  EDUCATION: School: [employer] 3 in person and 2 online classes  Year/Grade:  Agricultural consultant: Other: Disability Services  Activities/ Exercise: intermittently walking at home and on campus, gym 3 times/week.   MEDICAL HISTORY: Individual  Medical History/ Review of Systems: None reported recently  Has been healthy with no visits to the PCP. WCC due yearly   Family Medical/ Social History:  Patient Lives with: mother and dog  MENTAL HEALTH: Mental Health Issues:   Depression and Anxiety taking Wellbutrin XL 300 mg daily with good results. Not currently in counseling and only PRN.    Allergies: Allergies  Allergen Reactions   Epiduo [Adapalene-Benzoyl Peroxide] Swelling    Pt had swelling in the face from medication    Current Medications:  Current Outpatient Medications  Medication Instructions   Amphetamine Sulfate (EVEKEO) 10 MG TABS 1 tablet, Oral, Every evening   buPROPion (WELLBUTRIN XL) 300 mg, Oral, Daily   COVID-19 mRNA bivalent vaccine, Pfizer, (PFIZER COVID-19 VAC BIVALENT) injection Intramuscular   ibuprofen (ADVIL) 600 mg, Oral, Every 6 hours PRN   loratadine (CLARITIN) 10 mg, Oral, Daily   Vyvanse 60 mg, Oral, BH-each morning   Medication Side Effects: None  DIAGNOSES:    ICD-10-CM   1. ADHD (attention deficit hyperactivity disorder), combined type  F90.2     2. Dysgraphia  R27.8     3. Learning difficulty  F81.9     4. patient counseled  Z79.899     5. Medication management  Z79.899      ASSESSMENT:     Michelle Blevins is a 21 year old female with a history of ADHD, Anxiety, Depression and L/D. She has been maintained on Wellbutrin, Vyvanase and Evekeo. Medication has been effective with no side effects and good symptom control. Michelle Blevins is starting classes today for [employer] and taking a full time course load with 3 in person classes  and 2 online classes. She has continued with disability services for learning support. Has been busy this summer with arts/crafts and writing a novel. Going to the gym 3 days a week and walking to stay active. NO changes with eating, sleeping or health. Not currently in counseling on a regular basis but can attend PRN if having any issues/concerns. Will continue with medication  regimen with no changes today.   PLAN/RECOMMENDATIONS:  School progress and classes registered for the fall dicussed with patient.  Support given for continued success with disability services.   Organization and time management discussed with continued success.   Staying active walking more ad attending the gym to work out 3 days weekly.   Eating well with no changes and encouraged healthy eating habits with a variety of foods.   Anxiety and depression with good control and not attending counseling regularly right now.   Sleep habits with no recent changes and continued with sleep schedule for school.   Medication management with current doses and times for dosing discusses with no changes.   Counseled medication pharmacokinetics, options, dosage, administration, desired effects, and possible side effects.   Vyvanse 60 mg daily, no Rx today Evekeo 10 mg PRN in the evening time, No Rx today Wellbutrin XL 300 mg daily, no Rx today   I discussed the assessment and treatment plan with the patient. The patient was provided an opportunity to ask questions and all were answered. The patient agreed with the plan and demonstrated an understanding of the instructions.   NEXT APPOINTMENT:  Visit date not found-f/u visit Telehealth OK  The patient was advised to call back or seek an in-person evaluation if the symptoms worsen or if the condition fails to improve as anticipated.   Carron Curie, NP

## 2022-08-09 ENCOUNTER — Other Ambulatory Visit (HOSPITAL_COMMUNITY): Payer: Self-pay

## 2022-09-10 ENCOUNTER — Other Ambulatory Visit (HOSPITAL_COMMUNITY): Payer: Self-pay

## 2022-09-14 ENCOUNTER — Other Ambulatory Visit: Payer: Self-pay

## 2022-09-14 DIAGNOSIS — F902 Attention-deficit hyperactivity disorder, combined type: Secondary | ICD-10-CM

## 2022-09-15 ENCOUNTER — Other Ambulatory Visit (HOSPITAL_COMMUNITY): Payer: Self-pay

## 2022-09-15 MED ORDER — AMPHETAMINE SULFATE 10 MG PO TABS
1.0000 | ORAL_TABLET | Freq: Every evening | ORAL | 0 refills | Status: AC
  Filled 2022-09-15: qty 30, 30d supply, fill #0

## 2022-09-15 MED ORDER — BUPROPION HCL ER (XL) 300 MG PO TB24
300.0000 mg | ORAL_TABLET | Freq: Every day | ORAL | 2 refills | Status: DC
Start: 2022-09-15 — End: 2022-11-17
  Filled 2022-09-15 – 2022-10-11 (×2): qty 30, 30d supply, fill #0

## 2022-09-15 MED ORDER — LISDEXAMFETAMINE DIMESYLATE 60 MG PO CAPS
60.0000 mg | ORAL_CAPSULE | ORAL | 0 refills | Status: DC
  Filled 2022-09-15: qty 30, 30d supply, fill #0

## 2022-09-15 NOTE — Telephone Encounter (Signed)
Vyvanse 60 mg daily ,# 30 with no RF's, Evekeo 10 mg daily # 30 with no RF's and Wellbutrin XL 300 mg daily, # 30 with 2 RF's.RX for above e-scribed and sent to pharmacy on record  [facility] [address] [place] [address] Phone: [phone] Fax: [phone]

## 2022-09-23 ENCOUNTER — Other Ambulatory Visit (HOSPITAL_COMMUNITY): Payer: Self-pay

## 2022-09-28 ENCOUNTER — Other Ambulatory Visit (HOSPITAL_COMMUNITY): Payer: Self-pay

## 2022-10-11 ENCOUNTER — Other Ambulatory Visit (HOSPITAL_COMMUNITY): Payer: Self-pay

## 2022-11-17 ENCOUNTER — Other Ambulatory Visit (HOSPITAL_COMMUNITY): Payer: Self-pay

## 2022-11-17 ENCOUNTER — Encounter: Payer: Self-pay | Admitting: Family

## 2022-11-17 ENCOUNTER — Ambulatory Visit (INDEPENDENT_AMBULATORY_CARE_PROVIDER_SITE_OTHER): Payer: 59 | Admitting: Family

## 2022-11-17 VITALS — BP 112/64 | HR 76 | Resp 16 | Ht 64.0 in | Wt 241.0 lb

## 2022-11-17 DIAGNOSIS — Z7189 Other specified counseling: Secondary | ICD-10-CM | POA: Diagnosis not present

## 2022-11-17 DIAGNOSIS — F819 Developmental disorder of scholastic skills, unspecified: Secondary | ICD-10-CM

## 2022-11-17 DIAGNOSIS — Z79899 Other long term (current) drug therapy: Secondary | ICD-10-CM | POA: Diagnosis not present

## 2022-11-17 DIAGNOSIS — R278 Other lack of coordination: Secondary | ICD-10-CM

## 2022-11-17 DIAGNOSIS — F902 Attention-deficit hyperactivity disorder, combined type: Secondary | ICD-10-CM | POA: Diagnosis not present

## 2022-11-17 DIAGNOSIS — F411 Generalized anxiety disorder: Secondary | ICD-10-CM | POA: Diagnosis not present

## 2022-11-17 DIAGNOSIS — Z8659 Personal history of other mental and behavioral disorders: Secondary | ICD-10-CM | POA: Diagnosis not present

## 2022-11-17 MED ORDER — BUPROPION HCL ER (XL) 300 MG PO TB24
300.0000 mg | ORAL_TABLET | Freq: Every day | ORAL | 0 refills | Status: DC
Start: 2022-11-17 — End: 2023-01-06
  Filled 2022-11-17: qty 90, 90d supply, fill #0

## 2022-11-17 NOTE — Progress Notes (Addendum)
[facility] [address] [place] [address] Dept: [phone] Dept Fax: [phone] Loc: [phone] Loc Fax: [phone]  Medication Check  Patient ID: Lady Saucier, female  DOB: 12/29/2000, 21 y.o.  MRN: [id number]  Date of Evaluation: 11/17/2022 PCP: Deeann Saint, MD  Accompanied by:  self Patient Lives with: mother  HISTORY/CURRENT STATUS: HPI Patient here by herself for the visit today. Patient interactive and appropriate with provider. Patient with no significant changes reported. Has continued with medication regimen with good efficacy reported.   EDUCATION: School: [employer] Year/Grade:  college   Homework Hours Spent: depending on the class demands Performance/ Grades: average Services: disability services Activities/ Exercise:  arts & crafts, Nurse, mental health, walking the dog  MEDICAL HISTORY: Appetite: Eating well with no changes  MVI/Other: occasional vitamins  Sleep: Bedtime: 9-10:00 pm  Awakens: 6:00 am  Concerns: Initiation/Maintenance/Other: None reported recently  Individual Medical History/ Review of Systems: Changes? :None reported. Had a cold recently but nothing significant.  Allergies: Epiduo [adapalene-benzoyl peroxide]  Current Medications:  Current Outpatient Medications  Medication Instructions   Amphetamine Sulfate (EVEKEO) 10 MG TABS 1 tablet, Oral, Every evening   buPROPion (WELLBUTRIN XL) 300 mg, Oral, Daily   COVID-19 mRNA bivalent vaccine, Pfizer, (PFIZER COVID-19 VAC BIVALENT) injection Intramuscular   ibuprofen (ADVIL) 600 mg, Oral, Every 6 hours PRN   lisdexamfetamine (VYVANSE) 60 mg, Oral, BH-each morning   loratadine (CLARITIN) 10 mg, Oral, Daily   Medication Side Effects: None Family Medical/ Social History: Changes? Yes, grandfather recently with some memory loss.   MENTAL  HEALTH: Mental Health Issues: Depression and Anxiety-well maintained with Wellbutrin XL 300 mg daily for symptom management.   PHYSICAL EXAM; Vitals:   General Physical Exam: Unchanged from previous exam, date:07/26/2022 Changed:None  DIAGNOSES:    ICD-10-CM   1. ADHD (attention deficit hyperactivity disorder), combined type  F90.2     2. Dysgraphia  R27.8     3. Learning difficulty  F81.9     4. patient counseled  Z79.899     5. Generalized anxiety disorder  F41.1     6. History of depression  Z86.59     7. Medication management  Z79.899     8. Goals of care, counseling/discussion  Z71.89     ASSESSMENT: Lorrinda is a 21 year old female with a history of ADHD, L/D, Anxiety and depression. Patient has been on Wellbutrin XL 300 mg daily, Vyvanse 60 mg on some school days with Evekeo 10 mg in the evening. Academically doing well with her college classes this semester. Looking for spring semester classes. Disability services are in place for learning support. Considering transferring to [employer] after her degree completion at [employer] for Psychology. Not currently working but participating with various arts and crafts with learning new skills to include knitting and crocheting. Staying active with walking the dog daily. No concerns with eating. No change with health in the past few months. Sleeping well with no concerns. Medication to remain the same.   RECOMMENDATIONS:  Updates with college classes and will apply for spring semester classes soon.  Disability services are in place at school for continued learning support.   Thinking about transferring to [employer] for psychology after completion of his 2 year degree.   Managing her emotional regulation along with relationship with mother.  Has counseling as needed but currently not attending on a regular basis.   Encouraged more  movement on a regular basis at least 20 minutes daily.   Healthy eating habits discussed with patient for health  promotion.  Sleeping schedule and sleep habits discussed with patient with good success.  Medication management discussed with patient and efficacy with current dosing.   Counseled medication pharmacokinetics, options, dosage, administration, desired effects, and possible side effects.   Vyvanse 60 mg daily, no Rx today Evekeo 10 mg PRN in the evening time, No Rx today Wellbutrin XL 300 mg daily, #90 with no RF's. Grayce Sessions for above e-scribed and sent to pharmacy on record  [facility] [address] [place] [address] Phone: [phone] Fax: [phone]  I discussed the assessment and treatment plan with the patient. The patient was provided an opportunity to ask questions and all were answered. The patient agreed with the plan and demonstrated an understanding of the instructions.   NEXT APPOINTMENT: Return in about 3 months (around 02/16/2023) for f/u visit .  The patient was advised to call back or seek an in-person evaluation if the symptoms worsen or if the condition fails to improve as anticipated.   Carolann Littler, NP  Counseling Time: 25 mins Total Contact Time: 28 mins

## 2022-11-24 ENCOUNTER — Institutional Professional Consult (permissible substitution): Payer: 59 | Admitting: Family

## 2023-01-06 ENCOUNTER — Other Ambulatory Visit: Payer: Self-pay

## 2023-01-06 ENCOUNTER — Other Ambulatory Visit (HOSPITAL_COMMUNITY): Payer: Self-pay

## 2023-01-06 MED ORDER — BUPROPION HCL ER (XL) 300 MG PO TB24
300.0000 mg | ORAL_TABLET | Freq: Every day | ORAL | 1 refills | Status: DC
  Filled 2023-01-06: qty 90, 90d supply, fill #0

## 2023-02-11 ENCOUNTER — Telehealth (INDEPENDENT_AMBULATORY_CARE_PROVIDER_SITE_OTHER): Payer: 59 | Admitting: Family

## 2023-02-11 ENCOUNTER — Other Ambulatory Visit (HOSPITAL_COMMUNITY): Payer: Self-pay

## 2023-02-11 ENCOUNTER — Encounter: Payer: Self-pay | Admitting: Family

## 2023-02-11 ENCOUNTER — Other Ambulatory Visit: Payer: Self-pay

## 2023-02-11 DIAGNOSIS — R278 Other lack of coordination: Secondary | ICD-10-CM | POA: Diagnosis not present

## 2023-02-11 DIAGNOSIS — F819 Developmental disorder of scholastic skills, unspecified: Secondary | ICD-10-CM

## 2023-02-11 DIAGNOSIS — Z79899 Other long term (current) drug therapy: Secondary | ICD-10-CM

## 2023-02-11 DIAGNOSIS — F902 Attention-deficit hyperactivity disorder, combined type: Secondary | ICD-10-CM | POA: Diagnosis not present

## 2023-02-11 DIAGNOSIS — Z7189 Other specified counseling: Secondary | ICD-10-CM

## 2023-02-11 MED ORDER — LISDEXAMFETAMINE DIMESYLATE 60 MG PO CAPS
60.0000 mg | ORAL_CAPSULE | ORAL | 0 refills | Status: DC
  Filled 2023-02-11: qty 30, 30d supply, fill #0

## 2023-02-11 MED ORDER — BUPROPION HCL ER (XL) 300 MG PO TB24
300.0000 mg | ORAL_TABLET | Freq: Every day | ORAL | 1 refills | Status: DC
  Filled 2023-02-11: qty 90, 90d supply, fill #0
  Filled 2023-04-07 – 2023-05-16 (×2): qty 90, 90d supply, fill #1

## 2023-02-11 NOTE — Progress Notes (Signed)
[facility] [place] [place] [address] Dept: [phone] Dept Fax: [phone]  Medication Check visit via Virtual Video   Patient ID:  Michelle Blevins  female DOB: 2001/04/15   22 y.o.   MRN: [id number]   DATE:02/11/23  PCP: Billie Ruddy, MD  Virtual Visit via Video Note I connected with  Michelle Blevins on 02/11/23 at 11:00 AM EST by a video enabled telemedicine application and verified that I am speaking with the correct person using two identifiers. Patient/Parent Location: at home  I discussed the limitations, risks, security and privacy concerns of performing an evaluation and management service by telephone and the availability of in person appointments. I also discussed with the parents that there may be a patient responsible charge related to this service. The parents expressed understanding and agreed to proceed.  Provider: Carolann Littler, NP  Location: private work location  HPI/CURRENT STATUS: Michelle Blevins is here for medication management of the psychoactive medications for ADHD and review of educational and behavioral concerns.   Michelle Blevins currently taking Vyvanse 60 mg on class days and Wellbutrin XL 300 mg daily, which is working well. Takes medication at 0700. Medication tends to wear off around 1700 each day she takes the medication. Michelle Blevins is able to focus through school & homework.   Michelle Blevins is eating well (eating breakfast, lunch and dinner). Michelle Blevins does not have appetite suppression  Sleeping well (goes to bed at 2300 wakes at 0600-0700), sleeping through the night. Michelle Blevins does not have delayed sleep onset and depending on the day with depend on her schedule.   EDUCATION: School: [employer] Year/Grade:  college   Performance/ Grades: above average Services: Disability Services 2 Spanish Classes to graduate  Activities/ Exercise: intermittently-walking the  dog, writing a BOOK right now.   MEDICAL HISTORY: Individual Medical History/ Review of Systems: None reported. Has been healthy with no visits to the PCP. Butts due yearly.   Family Medical/ Social History: Michelle Blevins Lives with: mother  MENTAL HEALTH: Mental Health Issues: Depression and Anxiety=-Wellbutrin XL 300 mg daily with good symptom control.   Allergies: Allergies  Allergen Reactions   Epiduo [Adapalene-Benzoyl Peroxide] Swelling    Pt had swelling in the face from medication   Current Medications:  Current Outpatient Medications on File Prior to Visit  Medication Sig Dispense Refill   Amphetamine Sulfate (EVEKEO) 10 MG TABS Take 1 tablet by mouth every evening. 30 tablet 0   COVID-19 mRNA bivalent vaccine, Pfizer, (PFIZER COVID-19 VAC BIVALENT) injection Inject into the muscle. (Patient not taking: Reported on 07/26/2022) 0.3 mL 0   ibuprofen (ADVIL,MOTRIN) 600 MG tablet Take 1 tablet (600 mg total) by mouth every 6 (six) hours as needed. (Patient not taking: Reported on 07/26/2022) 30 tablet 0   loratadine (CLARITIN) 10 MG tablet Take 10 mg by mouth daily. (Patient not taking: Reported on 02/11/2023)     No current facility-administered medications on file prior to visit.  Medication Side Effects: None  DIAGNOSES:    ICD-10-CM   1. ADHD (attention deficit hyperactivity disorder), combined type  F90.2     2. Dysgraphia  R27.8     3. Learning difficulty  F81.9     4. patient counseled  Z79.899     5. Medication management  Z79.899     6. Goals of care, counseling/discussion  Z71.89     ASSESSMENT:      Michelle Blevins is a 21 year  old female with a history of ADHD, L/D, Anxiety and Depression. She has been maintained on Vyvanse 60 mg on school days and Wellbutrin XL 300 mg daily. Occasionally will use the Evekeo 10 mg in the afternoon for homework. Good efficacy reported and no side effects. She has continued with academic success at [employer] with only 2 classes left for degree  completion. Has her disability services in place for support. Plans to continued with Psychology at [employer]. No concerns with eating, sleeping or health. No changes with medication. Transition of care discussed today.   PLAN/RECOMMENDATIONS:  Discussed school progress at [employer] with plans for summer classes and degree completion.   Transition to [employer] discussed with continuation of psychology.  Disability services in place at school and will transition with her to [employer].  Discussed health, eating and exercise for recommendations.  Supported continuation of Art and Writing in her spare time.  Socialization with peers recommended on a regular basis.  Sleep habits discussed with no concerns.  Medication management if current regimen discussed with no changes.  Return to PCP or transition care for medication management.   Counseled medication pharmacokinetics, options, dosage, administration, desired effects, and possible side effects.   Vyvanse 60 mg daily, #30 with no RF's Evekeo 10 ng PRN, no Rx today Wellbutrin XL 300 mg daily, #90 with 1 RF's.RX for above e-scribed and sent to pharmacy on record  [facility] [address] [place] [address] Phone: [phone] Fax: [phone]  I discussed the assessment and treatment plan with Michelle Blevins. Michelle Blevins was provided an opportunity to ask questions and all were answered. Michelle Blevins agreed with the plan and demonstrated an understanding of the instructions.  REVIEW OF CHART, FACE TO FACE CLINIC TIME AND DOCUMENTATION TIME DURING TODAY'S VISIT:  30 mins      NEXT APPOINTMENT:  Return to PCP  The patient was advised to call back or seek an in-person evaluation if the symptoms worsen or if the condition fails to improve as anticipated.   Carolann Littler, NP

## 2023-03-30 ENCOUNTER — Encounter: Payer: Self-pay | Admitting: Family Medicine

## 2023-03-30 ENCOUNTER — Ambulatory Visit (INDEPENDENT_AMBULATORY_CARE_PROVIDER_SITE_OTHER): Payer: 59 | Admitting: Family Medicine

## 2023-03-30 VITALS — BP 108/78 | HR 100 | Temp 98.5°F | Wt 248.2 lb

## 2023-03-30 DIAGNOSIS — F40298 Other specified phobia: Secondary | ICD-10-CM | POA: Diagnosis not present

## 2023-03-30 DIAGNOSIS — F320 Major depressive disorder, single episode, mild: Secondary | ICD-10-CM

## 2023-03-30 DIAGNOSIS — Z6841 Body Mass Index (BMI) 40.0 and over, adult: Secondary | ICD-10-CM

## 2023-03-30 DIAGNOSIS — F411 Generalized anxiety disorder: Secondary | ICD-10-CM

## 2023-03-30 DIAGNOSIS — Z711 Person with feared health complaint in whom no diagnosis is made: Secondary | ICD-10-CM

## 2023-03-30 LAB — CBC WITH DIFFERENTIAL/PLATELET
Basophils Absolute: 0.1 10*3/uL (ref 0.0–0.1)
Basophils Relative: 0.6 % (ref 0.0–3.0)
Eosinophils Absolute: 0.2 10*3/uL (ref 0.0–0.7)
Eosinophils Relative: 2.6 % (ref 0.0–5.0)
HCT: 38.5 % (ref 36.0–46.0)
Hemoglobin: 12.1 g/dL (ref 12.0–15.0)
Lymphocytes Relative: 24.5 % (ref 12.0–46.0)
Lymphs Abs: 2.2 10*3/uL (ref 0.7–4.0)
MCHC: 31.4 g/dL (ref 30.0–36.0)
MCV: 72.5 fl — ABNORMAL LOW (ref 78.0–100.0)
Monocytes Absolute: 0.4 10*3/uL (ref 0.1–1.0)
Monocytes Relative: 4.5 % (ref 3.0–12.0)
Neutro Abs: 6.2 10*3/uL (ref 1.4–7.7)
Neutrophils Relative %: 67.8 % (ref 43.0–77.0)
Platelets: 488 10*3/uL — ABNORMAL HIGH (ref 150.0–400.0)
RBC: 5.31 Mil/uL — ABNORMAL HIGH (ref 3.87–5.11)
RDW: 17 % — ABNORMAL HIGH (ref 11.5–15.5)
WBC: 9.2 10*3/uL (ref 4.0–10.5)

## 2023-03-30 LAB — LIPID PANEL
Cholesterol: 147 mg/dL (ref 0–200)
HDL: 40.8 mg/dL (ref >39.00)
LDL Cholesterol: 83 mg/dL (ref 0–99)
NonHDL: 106.49
Total CHOL/HDL Ratio: 4
Triglycerides: 119 mg/dL (ref 0.0–149.0)
VLDL: 23.8 mg/dL (ref 0.0–40.0)

## 2023-03-30 LAB — COMPREHENSIVE METABOLIC PANEL
ALT: 16 U/L (ref 0–35)
AST: 15 U/L (ref 0–37)
Albumin: 4.4 g/dL (ref 3.5–5.2)
Alkaline Phosphatase: 77 U/L (ref 39–117)
BUN: 15 mg/dL (ref 6–23)
CO2: 24 mEq/L (ref 19–32)
Calcium: 9.3 mg/dL (ref 8.4–10.5)
Chloride: 105 mEq/L (ref 96–112)
Creatinine, Ser: 0.73 mg/dL (ref 0.40–1.20)
GFR: 116.82 mL/min (ref >60.00)
Glucose, Bld: 91 mg/dL (ref 70–99)
Potassium: 3.9 mEq/L (ref 3.5–5.1)
Sodium: 139 mEq/L (ref 135–145)
Total Bilirubin: 0.2 mg/dL (ref 0.2–1.2)
Total Protein: 7.8 g/dL (ref 6.0–8.3)

## 2023-03-30 LAB — T4, FREE: Free T4: 0.82 ng/dL (ref 0.60–1.60)

## 2023-03-30 LAB — VITAMIN D 25 HYDROXY (VIT D DEFICIENCY, FRACTURES): VITD: 16.16 ng/mL — ABNORMAL LOW (ref 30.00–100.00)

## 2023-03-30 LAB — HEMOGLOBIN A1C: Hgb A1c MFr Bld: 6 % (ref 4.6–6.5)

## 2023-03-30 LAB — TSH: TSH: 1.46 u[IU]/mL (ref 0.35–5.50)

## 2023-03-30 NOTE — Progress Notes (Signed)
Established Patient Office Visit   Subjective  Patient ID: Michelle Blevins, female    DOB: 2001/06/05  Age: 22 y.o. MRN: [id number]  Chief Complaint  Patient presents with   Weight Loss    Wants to discuss wt loss, the medical way.   Follow-up    Has fam hx of DM and, wants to discuss that and get it checked    Pt is a 21 yo female with pmh sig for ADHD, GAD, depression, dyspraxia, learning difficulty, mild persistent asthma who was seen for f/u.  Pt states she is interested in help losing weight.  Pt walks her dog 30 min per day.   Does not eat breakfast.  Pt concerned about HTN and DM 2/2 family hx on her dad's side.  Pt endorses anxiety symptoms are improving.  States overall mood, sleep, energy, and appetite are good.  Currently on Wellbutrin XL 300 mg daily which seems to be helpful.  Taking Vyvanse 60 mg for ADHD.  Followed by psychology, Eula Flax, NP.  States doing well in school at [employer].  Patient decided to take a few additional classes there, then will decide if she wants to transfer to a 4-year university.      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   Past Surgical History:  Procedure Laterality Date   TONSILLECTOMY     Social History   Tobacco Use   Smoking status: Never   Smokeless tobacco: Never  Vaping Use   Vaping Use: Never used  Substance Use Topics   Alcohol use: No   Drug use: No   Family History  Problem Relation Age of Onset   Breast cancer Mother    Allergies  Allergen Reactions   Epiduo [Adapalene-Benzoyl Peroxide] Swelling    Pt had swelling in the face from medication      ROS Negative unless stated above    Objective:     BP 108/78 (BP Location: Right Arm, Patient Position: Sitting, Cuff  Size: Large)   Pulse 100   Temp 98.5 F (36.9 C) (Oral)   Wt 248 lb 3.2 oz (112.6 kg)   SpO2 96%   BMI 42.60 kg/m  BP Readings from Last 3 Encounters:  03/30/23 108/78  11/17/22 112/64  10/05/21 108/68   Wt Readings from Last 3 Encounters:  03/30/23 248 lb 3.2 oz (112.6 kg)  11/17/22 241 lb (109.3 kg)  10/05/21 248 lb (112.5 kg)      Physical Exam Constitutional:      General: She is not in acute distress.    Appearance: Normal appearance.  HENT:     Head: Normocephalic and atraumatic.     Nose: Nose normal.     Mouth/Throat:     Mouth: Mucous membranes are moist.  Cardiovascular:     Rate and Rhythm: Normal rate and regular rhythm.     Heart sounds: Normal heart sounds. No murmur heard.    No gallop.  Pulmonary:     Effort: Pulmonary effort is normal. No respiratory distress.     Breath sounds: Normal breath sounds. No wheezing, rhonchi or rales.  Skin:    General: Skin is warm and dry.  Neurological:     Mental Status: She is alert and oriented to person, place, and time.  No results found for any visits on 03/30/23.    03/30/2023    9:02 AM 05/07/2016    8:44 AM  Depression screen PHQ 2/9  Decreased Interest 1 0  Down, Depressed, Hopeless 1 0  PHQ - 2 Score 2 0  Altered sleeping 1   Tired, decreased energy 2   Change in appetite 0   Feeling bad or failure about yourself  0   Trouble concentrating 0   Moving slowly or fidgety/restless 0   Suicidal thoughts 1   PHQ-9 Score 6   Difficult doing work/chores Somewhat difficult       03/30/2023    9:02 AM  GAD 7 : Generalized Anxiety Score  Nervous, Anxious, on Edge 1  Control/stop worrying 1  Worry too much - different things 0  Trouble relaxing 2  Easily annoyed or irritable 0  Afraid - awful might happen 1  Anxiety Difficulty Somewhat difficult       Assessment & Plan:  Class 3 severe obesity with serious comorbidity and body mass index (BMI) of 40.0 to 44.9 in adult, unspecified obesity  type -Body mass index is 42.6 kg/m. -wt 248 lbs this visit -Discussed lifestyle modifications -Referral to weight management placed -     TSH -     T4, free -     Hemoglobin A1c -     VITAMIN D 25 Hydroxy (Vit-D Deficiency, Fractures) -     Lipid panel -     Comprehensive metabolic panel -     CBC with Differential/Platelet -     Referral to weight management  GAD (generalized anxiety disorder) -GAD-7 score 1 -Continue Wellbutrin XL 300 mg daily -[facility] [address] -     TSH -     T4, free  Depression, major, single episode, mild -PHQ-9 score 6 -Continue current medication Wellbutrin XL 300 mg daily. -Provided with resources -Continue follow-up with [facility] [address] -     TSH -     T4, free  Concerned about diabetes mellitus without diagnosis -Paternal family history of DM      -      Hemoglobin A1c  Needle phobia -Labs drawn in exam room with patient laying down.  Does okay if does not see needle for blood draw.  Okay with IM injections.  Return in about 8 weeks (around 05/25/2023), or if symptoms worsen or fail to improve.   Deeann Saint, MD

## 2023-04-01 ENCOUNTER — Other Ambulatory Visit (HOSPITAL_COMMUNITY): Payer: Self-pay

## 2023-04-01 ENCOUNTER — Encounter: Payer: Self-pay | Admitting: Family Medicine

## 2023-04-01 ENCOUNTER — Other Ambulatory Visit: Payer: Self-pay | Admitting: Family Medicine

## 2023-04-01 DIAGNOSIS — E559 Vitamin D deficiency, unspecified: Secondary | ICD-10-CM

## 2023-04-01 DIAGNOSIS — R7303 Prediabetes: Secondary | ICD-10-CM | POA: Insufficient documentation

## 2023-04-01 MED ORDER — VITAMIN D (ERGOCALCIFEROL) 1.25 MG (50000 UNIT) PO CAPS
50000.0000 [IU] | ORAL_CAPSULE | ORAL | 0 refills | Status: DC
Start: 2023-04-01 — End: 2023-06-23
  Filled 2023-04-01: qty 12, 84d supply, fill #0

## 2023-04-08 ENCOUNTER — Other Ambulatory Visit (HOSPITAL_COMMUNITY): Payer: Self-pay

## 2023-04-10 ENCOUNTER — Encounter: Payer: Self-pay | Admitting: Family Medicine

## 2023-05-16 ENCOUNTER — Ambulatory Visit: Payer: 59 | Admitting: Bariatrics

## 2023-05-16 ENCOUNTER — Encounter: Payer: Self-pay | Admitting: Bariatrics

## 2023-05-16 ENCOUNTER — Other Ambulatory Visit (HOSPITAL_COMMUNITY): Payer: Self-pay

## 2023-05-16 VITALS — BP 122/77 | HR 84 | Temp 98.0°F | Ht 64.0 in | Wt 249.0 lb

## 2023-05-16 DIAGNOSIS — R7303 Prediabetes: Secondary | ICD-10-CM

## 2023-05-16 DIAGNOSIS — E559 Vitamin D deficiency, unspecified: Secondary | ICD-10-CM

## 2023-05-16 DIAGNOSIS — Z0289 Encounter for other administrative examinations: Secondary | ICD-10-CM

## 2023-05-16 DIAGNOSIS — Z6841 Body Mass Index (BMI) 40.0 and over, adult: Secondary | ICD-10-CM | POA: Diagnosis not present

## 2023-05-16 NOTE — Progress Notes (Signed)
Office: [phone]  /  Fax: [phone]   Initial Visit  Michelle Blevins was seen in clinic today to evaluate for obesity. She is interested in losing weight to improve overall health and reduce the risk of weight related complications. She presents today to review program treatment options, initial physical assessment, and evaluation.     She was referred by: PCP  When asked what else they would like to accomplish? She states: Adopt healthier eating patterns, Improve energy levels and physical activity, Improve existing medical conditions, and Improve quality of life  When asked how has your weight affected you? She states: Contributed to medical problems, Having fatigue, and Having poor endurance  Some associated conditions: Prediabetes, Vitamin D Deficiency, and Other: depression  Contributing factors: Family history, Reduced physical activity, and Eating patterns  Weight promoting medications identified: None  Current nutrition plan: Low-carb  Current level of physical activity: Walking  Current or previous pharmacotherapy: None  Response to medication: Never tried medications   Past medical history includes:   Past Medical History:  Diagnosis Date   ADHD (attention deficit hyperactivity disorder)    Asthma      Objective:   BP 122/77   Pulse 84   Temp 98 F (36.7 C)   Ht 5\' 4"  (1.626 m)   Wt 249 lb (112.9 kg)   SpO2 96%   BMI 42.74 kg/m  She was weighed on the bioimpedance scale: Body mass index is 42.74 kg/m.  Peak Weight:252 lbs , Body Fat%:49.3, Visceral Fat Rating:12, Weight trend over the last 12 months: Fluctuating.   General:  Alert, oriented and cooperative. Patient is in no acute distress.  Respiratory: Normal respiratory effort, no problems with respiration noted  Extremities: Normal range of motion.    Mental Status: Normal mood and affect. Normal behavior. Normal judgment and thought content.   DIAGNOSTIC DATA REVIEWED:  BMET    Component  Value Date/Time   NA 139 03/30/2023 0938   K 3.9 03/30/2023 0938   CL 105 03/30/2023 0938   CO2 24 03/30/2023 0938   GLUCOSE 91 03/30/2023 0938   BUN 15 03/30/2023 0938   CREATININE 0.73 03/30/2023 0938   CALCIUM 9.3 03/30/2023 0938   Lab Results  Component Value Date   HGBA1C 6.0 03/30/2023   No results found for: "INSULIN" CBC    Component Value Date/Time   WBC 9.2 03/30/2023 0938   RBC 5.31 (H) 03/30/2023 0938   HGB 12.1 03/30/2023 0938   HCT 38.5 03/30/2023 0938   PLT 488.0 (H) 03/30/2023 0938   MCV 72.5 (L) 03/30/2023 0938   MCHC 31.4 03/30/2023 0938   RDW 17.0 (H) 03/30/2023 0938   Iron/TIBC/Ferritin/ %Sat No results found for: "IRON", "TIBC", "FERRITIN", "IRONPCTSAT" Lipid Panel     Component Value Date/Time   CHOL 147 03/30/2023 0938   TRIG 119.0 03/30/2023 0938   HDL 40.80 03/30/2023 0938   CHOLHDL 4 03/30/2023 0938   VLDL 23.8 03/30/2023 0938   LDLCALC 83 03/30/2023 0938   Hepatic Function Panel     Component Value Date/Time   PROT 7.8 03/30/2023 0938   ALBUMIN 4.4 03/30/2023 0938   AST 15 03/30/2023 0938   ALT 16 03/30/2023 0938   ALKPHOS 77 03/30/2023 0938   BILITOT 0.2 03/30/2023 0938      Component Value Date/Time   TSH 1.46 03/30/2023 0938     Assessment and Plan:   Prediabetes Last A1c was 6.0  Lab Results  Component Value Date   HGBA1C 6.0  03/30/2023   No results found for: "INSULIN"  Plan: Will minimize all refined carbohydrates both sweets and starches.  Consider both aerobic and resistance training.  Will return to the office  for an initial visit.    Vitamin D deficiency:   She is taking vitamin D.   Plan: Continue vitamin D.     Morbid Obesity: Current BMI 42    Obesity Treatment / Action Plan:  Reviewed the New Patient/late Arrival /Cancellation Policies.   Patient will work on garnering support from family and friends to begin weight loss journey. Will work on eliminating or reducing the presence of highly  palatable, calorie dense foods in the home. Will complete provided nutritional and psychosocial assessment questionnaire before the next appointment. Will be scheduled for indirect calorimetry to determine resting energy expenditure in a fasting state.  This will allow [place] to create a reduced calorie, high-protein meal plan to promote loss of fat mass while preserving muscle mass. Counseled on the health benefits of losing 5%-15% of total body weight. Was counseled on nutritional approaches to weight loss and benefits of reducing processed foods and consuming plant-based foods and high quality protein as part of nutritional weight management. Was counseled on pharmacotherapy and role as an adjunct in weight management.   Obesity Education Performed Today:  She was weighed on the bioimpedance scale and results were discussed and documented in the synopsis.  We discussed obesity as a disease and the importance of a more detailed evaluation of all the factors contributing to the disease.  We discussed the importance of long term lifestyle changes which include nutrition, exercise and behavioral modifications as well as the importance of customizing this to her specific health and social needs.  We discussed the benefits of reaching a healthier weight to alleviate the symptoms of existing conditions and reduce the risks of the biomechanical, metabolic and psychological effects of obesity.  Discussed New Patient/Late Arrival, and Cancellation Policies. Patient voiced understanding and allowed to ask questions.   Zaakirah Dalomba appears to be in the action stage of change and states they are ready to start intensive lifestyle modifications and behavioral modifications.  30 minutes was spent today on this visit including the above counseling, pre-visit chart review, and post-visit documentation.  Reviewed by clinician on day of visit: allergies, medications, problem list, medical history, surgical  history, family history, social history, and previous encounter notes.    [name].

## 2023-06-01 ENCOUNTER — Encounter: Payer: Self-pay | Admitting: Bariatrics

## 2023-06-01 ENCOUNTER — Encounter (INDEPENDENT_AMBULATORY_CARE_PROVIDER_SITE_OTHER): Payer: 59 | Admitting: Bariatrics

## 2023-06-01 DIAGNOSIS — Z0289 Encounter for other administrative examinations: Secondary | ICD-10-CM

## 2023-06-08 NOTE — Progress Notes (Signed)
This encounter was created in error - please disregard.

## 2023-06-09 ENCOUNTER — Ambulatory Visit: Payer: 59 | Admitting: Bariatrics

## 2023-06-09 ENCOUNTER — Encounter: Payer: Self-pay | Admitting: Bariatrics

## 2023-06-09 VITALS — BP 114/76 | HR 91 | Temp 98.3°F | Ht 64.0 in | Wt 253.0 lb

## 2023-06-09 DIAGNOSIS — E559 Vitamin D deficiency, unspecified: Secondary | ICD-10-CM

## 2023-06-09 DIAGNOSIS — R5383 Other fatigue: Secondary | ICD-10-CM | POA: Diagnosis not present

## 2023-06-09 DIAGNOSIS — E538 Deficiency of other specified B group vitamins: Secondary | ICD-10-CM | POA: Diagnosis not present

## 2023-06-09 DIAGNOSIS — Z6841 Body Mass Index (BMI) 40.0 and over, adult: Secondary | ICD-10-CM

## 2023-06-09 DIAGNOSIS — Z0000 Encounter for general adult medical examination without abnormal findings: Secondary | ICD-10-CM | POA: Diagnosis not present

## 2023-06-09 DIAGNOSIS — Z1331 Encounter for screening for depression: Secondary | ICD-10-CM | POA: Diagnosis not present

## 2023-06-09 DIAGNOSIS — R7303 Prediabetes: Secondary | ICD-10-CM

## 2023-06-09 DIAGNOSIS — R0602 Shortness of breath: Secondary | ICD-10-CM

## 2023-06-10 LAB — INSULIN, RANDOM: INSULIN: 24.3 u[IU]/mL (ref 2.6–24.9)

## 2023-06-10 LAB — VITAMIN B12: Vitamin B-12: 953 pg/mL (ref 232–1245)

## 2023-06-10 LAB — VITAMIN D 25 HYDROXY (VIT D DEFICIENCY, FRACTURES): Vit D, 25-Hydroxy: 28.7 ng/mL — ABNORMAL LOW (ref 30.0–100.0)

## 2023-06-13 ENCOUNTER — Encounter (INDEPENDENT_AMBULATORY_CARE_PROVIDER_SITE_OTHER): Payer: Self-pay | Admitting: Bariatrics

## 2023-06-13 ENCOUNTER — Encounter: Payer: Self-pay | Admitting: Bariatrics

## 2023-06-13 DIAGNOSIS — E88819 Insulin resistance, unspecified: Secondary | ICD-10-CM | POA: Insufficient documentation

## 2023-06-13 NOTE — Progress Notes (Signed)
Chief Complaint:   OBESITY Michelle Blevins (MR# [id number]) is a 22 y.o. female who presents for evaluation and treatment of obesity and related comorbidities. Current BMI is Body mass index is 43.43 kg/m. Michelle Blevins has been struggling with her weight for many years and has been unsuccessful in either losing weight, maintaining weight loss, or reaching her healthy weight goal.  Michelle Blevins is currently in the action stage of change and ready to dedicate time achieving and maintaining a healthier weight. Michelle Blevins is interested in becoming our patient and working on intensive lifestyle modifications including (but not limited to) diet and exercise for weight loss.  Patient is here for her initial visit. She has an information session on 05/16/2023. She has had some stress eating.   Michelle Blevins's habits were reviewed today and are as follows: Her family eats meals together, she thinks her family will eat healthier with her, her desired weight loss is 103 lbs, she has been heavy most of her life, she started gaining weight with depression and stress, her heaviest weight ever was 250 pounds, she has significant food cravings issues, she snacks frequently in the evenings, she skips meals frequently, she is frequently drinking liquids with calories, and she struggles with emotional eating.  Depression Screen Michelle Blevins's Food and Mood (modified PHQ-9) score was 8.  Subjective:   1. Other fatigue Michelle Blevins admits to daytime somnolence and admits to waking up still tired. Patient has a history of symptoms of daytime fatigue and morning fatigue. Michelle Blevins generally gets 7 hours of sleep per night, and states that she has nightime awakenings and generally restful sleep. Snoring is present. Apneic episodes are not present. Epworth Sleepiness Score is 1.   2. SOB (shortness of breath) on exertion Michelle Blevins notes increasing shortness of breath with exercising and seems to be worsening over time with weight gain. She notes getting  out of breath sooner with activity than she used to. This has not gotten worse recently. Michelle Blevins denies shortness of breath at rest or orthopnea.  3. Vitamin D deficiency Patient is taking prescription Vitamin D.   4. Prediabetes Patient's last A1c was 6.0 and she is not on medications.   5. Health care maintenance Given obesity.   6. B12 deficiency Patient is not taking B12 supplement.   Assessment/Plan:   1. Other fatigue Michelle Blevins does feel that her weight is causing her energy to be lower than it should be. Fatigue may be related to obesity, depression or many other causes. Labs will be ordered, and in the meanwhile, Michelle Blevins will focus on self care including making healthy food choices, increasing physical activity and focusing on stress reduction.  - EKG 12-Lead  2. SOB (shortness of breath) on exertion Michelle Blevins does feel that she gets out of breath more easily that she used to when she exercises. Michelle Blevins's shortness of breath appears to be obesity related and exercise induced. She has agreed to work on weight loss and gradually increase exercise to treat her exercise induced shortness of breath. Will continue to monitor closely.  3. Vitamin D deficiency We will check labs today, and we will follow-up at patient's next visit.   - VITAMIN D 25 Hydroxy (Vit-D Deficiency, Fractures)  4. Prediabetes We will check labs today, and we will follow-up at patient's next visit. Patient will work on decreasing carbohydrates (sweets and starches).   - Insulin, random  5. Health care maintenance We will check labs today. EKG and IC were done and reviewed with the patient.  -  Vitamin B12 - VITAMIN D 25 Hydroxy (Vit-D Deficiency, Fractures) - Insulin, random  6. B12 deficiency We will check labs today, and we will follow-up at patient's next visit.  - Vitamin B12  7. Depression screening Michelle Blevins had a positive depression screening. Depression is commonly associated with obesity and often  results in emotional eating behaviors. We will monitor this closely and work on CBT to help improve the non-hunger eating patterns. Referral to Psychology may be required if no improvement is seen as she continues in our clinic.  8. Morbid obesity (HCC)  9. BMI 40.0-44.9, adult [facility]) Michelle Blevins is currently in the action stage of change and her goal is to continue with weight loss efforts. I recommend Michelle Blevins begin the structured treatment plan as follows:  She has agreed to the Category 3 Plan.  Meal planning was discussed. Reviewed labs with the patient from 03/30/2023, CMP, lipid, Vit D, CBC, A1c, and thyroid panel.   Exercise goals: No exercise has been prescribed at this time.   Behavioral modification strategies: increasing lean protein intake, decreasing simple carbohydrates, increasing vegetables, increasing water intake, decreasing eating out, no skipping meals, meal planning and cooking strategies, keeping healthy foods in the home, and planning for success.  She was informed of the importance of frequent follow-up visits to maximize her success with intensive lifestyle modifications for her multiple health conditions. She was informed we would discuss her lab results at her next visit unless there is a critical issue that needs to be addressed sooner. Michelle Blevins agreed to keep her next visit at the agreed upon time to discuss these results.  Objective:   Blood pressure 114/76, pulse 91, temperature 98.3 F (36.8 C), height 5\' 4"  (1.626 m), weight 253 lb (114.8 kg), SpO2 98 %. Body mass index is 43.43 kg/m.  EKG: Normal sinus rhythm, rate 94 BPM.  Indirect Calorimeter completed today shows a VO2 of 323 and a REE of 2232.  Her calculated basal metabolic rate is 8413 thus her basal metabolic rate is better than expected.  General: Cooperative, alert, well developed, in no acute distress. HEENT: Conjunctivae and lids unremarkable. Cardiovascular: Regular rhythm.  Lungs: Normal work of  breathing. Neurologic: No focal deficits.   Lab Results  Component Value Date   CREATININE 0.73 03/30/2023   BUN 15 03/30/2023   NA 139 03/30/2023   K 3.9 03/30/2023   CL 105 03/30/2023   CO2 24 03/30/2023   Lab Results  Component Value Date   ALT 16 03/30/2023   AST 15 03/30/2023   ALKPHOS 77 03/30/2023   BILITOT 0.2 03/30/2023   Lab Results  Component Value Date   HGBA1C 6.0 03/30/2023   Lab Results  Component Value Date   INSULIN 24.3 06/09/2023   Lab Results  Component Value Date   TSH 1.46 03/30/2023   Lab Results  Component Value Date   CHOL 147 03/30/2023   HDL 40.80 03/30/2023   LDLCALC 83 03/30/2023   TRIG 119.0 03/30/2023   CHOLHDL 4 03/30/2023   Lab Results  Component Value Date   WBC 9.2 03/30/2023   HGB 12.1 03/30/2023   HCT 38.5 03/30/2023   MCV 72.5 (L) 03/30/2023   PLT 488.0 (H) 03/30/2023   No results found for: "IRON", "TIBC", "FERRITIN"  Attestation Statements:   Reviewed by clinician on day of visit: allergies, medications, problem list, medical history, surgical history, family history, social history, and previous encounter notes.   Trude Mcburney, am acting as Energy manager for [employer],  DO.  I have reviewed the above documentation for accuracy and completeness, and I agree with the above. Corinna Capra, DO

## 2023-06-15 ENCOUNTER — Ambulatory Visit: Payer: 59 | Admitting: Bariatrics

## 2023-06-23 ENCOUNTER — Ambulatory Visit (INDEPENDENT_AMBULATORY_CARE_PROVIDER_SITE_OTHER): Payer: 59 | Admitting: Bariatrics

## 2023-06-23 ENCOUNTER — Other Ambulatory Visit (HOSPITAL_COMMUNITY): Payer: Self-pay

## 2023-06-23 ENCOUNTER — Encounter: Payer: Self-pay | Admitting: Bariatrics

## 2023-06-23 VITALS — BP 120/77 | HR 85 | Temp 97.8°F | Ht 64.0 in | Wt 257.0 lb

## 2023-06-23 DIAGNOSIS — E559 Vitamin D deficiency, unspecified: Secondary | ICD-10-CM | POA: Diagnosis not present

## 2023-06-23 DIAGNOSIS — E88819 Insulin resistance, unspecified: Secondary | ICD-10-CM | POA: Diagnosis not present

## 2023-06-23 DIAGNOSIS — Z6841 Body Mass Index (BMI) 40.0 and over, adult: Secondary | ICD-10-CM | POA: Diagnosis not present

## 2023-06-23 MED ORDER — VITAMIN D (ERGOCALCIFEROL) 1.25 MG (50000 UNIT) PO CAPS
50000.0000 [IU] | ORAL_CAPSULE | ORAL | 0 refills | Status: DC
Start: 2023-06-23 — End: 2023-07-28
  Filled 2023-06-23: qty 5, 35d supply, fill #0

## 2023-06-27 NOTE — Progress Notes (Unsigned)
     Chief Complaint:   OBESITY Michelle Blevins is here to discuss her progress with her obesity treatment plan along with follow-up of her obesity related diagnoses. Michelle Blevins is on {MWMwtlossportion/plan2:23431} and states she is following her eating plan approximately ***% of the time. Michelle Blevins states she is *** *** minutes *** times per week.  Today's visit was #: *** Starting weight: *** Starting date: *** Today's weight: *** Today's date: 06/23/2023 Total lbs lost to date: *** Total lbs lost since last in-office visit: ***  Interim History: ***  Subjective:   1. Vitamin D deficiency ***  2. Insulin resistance ***  Assessment/Plan:   1. Vitamin D deficiency *** - Vitamin D, Ergocalciferol, (DRISDOL) 1.25 MG (50000 UNIT) CAPS capsule; Take 1 capsule (50,000 Units total) by mouth every 7 (seven) days.  Dispense: 5 capsule; Refill: 0  2. Insulin resistance ***  3. Morbid obesity (HCC)  4. BMI 40.0-44.9, adult (HCC) Michelle Blevins {CHL AMB IS/IS NOT:210130109} currently in the action stage of change. As such, her goal is to {MWMwtloss#1:210800005}. She has agreed to {MWMwtlossportion/plan2:23431}.   Exercise goals: {MWM EXERCISE RECS:23473}  Behavioral modification strategies: {MWMwtlossdietstrategies3:23432}.  Michelle Blevins has agreed to follow-up with our clinic in {NUMBER 1-10:22536} weeks. She was informed of the importance of frequent follow-up visits to maximize her success with intensive lifestyle modifications for her multiple health conditions.   Objective:   Blood pressure 120/77, pulse 85, temperature 97.8 F (36.6 C), height 5\' 4"  (1.626 m), weight 257 lb (116.6 kg), SpO2 97%. Body mass index is 44.11 kg/m.  General: Cooperative, alert, well developed, in no acute distress. HEENT: Conjunctivae and lids unremarkable. Cardiovascular: Regular rhythm.  Lungs: Normal work of breathing. Neurologic: No focal deficits.   Lab Results  Component Value Date   CREATININE 0.73  03/30/2023   BUN 15 03/30/2023   NA 139 03/30/2023   K 3.9 03/30/2023   CL 105 03/30/2023   CO2 24 03/30/2023   Lab Results  Component Value Date   ALT 16 03/30/2023   AST 15 03/30/2023   ALKPHOS 77 03/30/2023   BILITOT 0.2 03/30/2023   Lab Results  Component Value Date   HGBA1C 6.0 03/30/2023   Lab Results  Component Value Date   INSULIN 24.3 06/09/2023   Lab Results  Component Value Date   TSH 1.46 03/30/2023   Lab Results  Component Value Date   CHOL 147 03/30/2023   HDL 40.80 03/30/2023   LDLCALC 83 03/30/2023   TRIG 119.0 03/30/2023   CHOLHDL 4 03/30/2023   Lab Results  Component Value Date   VD25OH 28.7 (L) 06/09/2023   VD25OH 16.16 (L) 03/30/2023   Lab Results  Component Value Date   WBC 9.2 03/30/2023   HGB 12.1 03/30/2023   HCT 38.5 03/30/2023   MCV 72.5 (L) 03/30/2023   PLT 488.0 (H) 03/30/2023   No results found for: "IRON", "TIBC", "FERRITIN"  Attestation Statements:   Reviewed by clinician on day of visit: allergies, medications, problem list, medical history, surgical history, family history, social history, and previous encounter notes.   Michelle Blevins, am acting as Energy manager for [employer], DO.  I have reviewed the above documentation for accuracy and completeness, and I agree with the above. -  ***

## 2023-07-14 ENCOUNTER — Ambulatory Visit: Payer: 59 | Admitting: Bariatrics

## 2023-07-14 ENCOUNTER — Encounter: Payer: Self-pay | Admitting: Bariatrics

## 2023-07-14 VITALS — BP 116/81 | HR 98 | Temp 98.4°F | Ht 64.0 in | Wt 255.0 lb

## 2023-07-14 DIAGNOSIS — Z6841 Body Mass Index (BMI) 40.0 and over, adult: Secondary | ICD-10-CM

## 2023-07-14 DIAGNOSIS — E559 Vitamin D deficiency, unspecified: Secondary | ICD-10-CM | POA: Diagnosis not present

## 2023-07-14 DIAGNOSIS — R7303 Prediabetes: Secondary | ICD-10-CM

## 2023-07-14 NOTE — Progress Notes (Signed)
   WEIGHT SUMMARY AND BIOMETRICS  Weight Lost Since Last Visit: 2lb  Vitals Temp: 98.4 F (36.9 C) BP: 116/81 Pulse Rate: 98 SpO2: 98 %   Anthropometric Measurements Height: 5\' 4"  (1.626 m) Weight: 255 lb (115.7 kg) BMI (Calculated): 43.75 Weight at Last Visit: 257lb Weight Lost Since Last Visit: 2lb Starting Weight: 253lb Total Weight Loss (lbs): 0 lb (0 kg)   Body Composition  Body Fat %: 50.8 % Fat Mass (lbs): 129.8 lbs Muscle Mass (lbs): 119.2 lbs Total Body Water (lbs): 91.2 lbs Visceral Fat Rating : 13   Other Clinical Data Fasting: no Labs: no Today's Visit #: 3 Starting Date: 06/09/23    OBESITY Rosie is here to discuss her progress with her obesity treatment plan along with follow-up of her obesity related diagnoses.     Nutrition Plan: the Category 3 plan - 90% adherence.  Current exercise: none  Interim History:  She is down 2 lbs since her last visit.  Eating all of the food on the plan., Protein intake is as prescribed, Is not drinking sugar sweetened beverages., Is not exceeding snack calorie allotment, Is not skipping meals, and Not journaling consistently.  Pharmacotherapy: Hunger is moderately controlled.  Cravings are moderately controlled.  Assessment/Plan:   1. Vitamin D deficiency Vitamin D Deficiency Vitamin D is not at goal of 50.  Most recent vitamin D level was 28.7. She is on  prescription ergocalciferol 50,000 IU weekly. Lab Results  Component Value Date   VD25OH 28.7 (L) 06/09/2023   VD25OH 16.16 (L) 03/30/2023    Plan: Refill prescription vitamin D 50,000 IU weekly.      Morbid Obesity: Current BMI BMI (Calculated): 43.75    [place] is currently in the action stage of change. As such, her goal is to continue with weight loss efforts.  She has agreed to the Category 3 plan.  Exercise goals: For substantial health benefits, adults should do at least 150 minutes (2 hours and 30 minutes) a week of  moderate-intensity, or 75 minutes (1 hour and 15 minutes) a week of vigorous-intensity aerobic physical activity, or an equivalent combination of moderate- and vigorous-intensity aerobic activity. Aerobic activity should be performed in episodes of at least 10 minutes, and preferably, it should be spread throughout the week.  Behavioral modification strategies: increasing lean protein intake, no meal skipping, meal planning , increase water intake, better snacking choices, planning for success, increasing vegetables, get rid of junk food in the home, and keep healthy foods in the home. She will adhere to the number of calories and protein prescribed ( Journaling numbers sheet ).   Cozette has agreed to follow-up with our clinic in 2 weeks.      Objective:   VITALS: Per patient if applicable, see vitals. GENERAL: Alert and in no acute distress. CARDIOPULMONARY: No increased WOB. Speaking in clear sentences.  PSYCH: Pleasant and cooperative. Speech normal rate and rhythm. Affect is appropriate. Insight and judgement are appropriate. Attention is focused, linear, and appropriate.  NEURO: Oriented as arrived to appointment on time with no prompting.   Attestation Statements:   This was prepared with the assistance of Engineer, civil (consulting).  Occasional wrong-word or sound-a-like substitutions may have occurred due to the inherent limitations of voice recognition software.   Corinna Capra, DO

## 2023-07-28 ENCOUNTER — Ambulatory Visit: Payer: 59 | Admitting: Nurse Practitioner

## 2023-07-28 ENCOUNTER — Other Ambulatory Visit (HOSPITAL_COMMUNITY): Payer: Self-pay

## 2023-07-28 ENCOUNTER — Encounter: Payer: Self-pay | Admitting: Nurse Practitioner

## 2023-07-28 VITALS — BP 117/68 | HR 84 | Temp 97.9°F | Ht 64.0 in | Wt 256.0 lb

## 2023-07-28 DIAGNOSIS — Z6841 Body Mass Index (BMI) 40.0 and over, adult: Secondary | ICD-10-CM

## 2023-07-28 DIAGNOSIS — R7303 Prediabetes: Secondary | ICD-10-CM | POA: Diagnosis not present

## 2023-07-28 DIAGNOSIS — E559 Vitamin D deficiency, unspecified: Secondary | ICD-10-CM | POA: Diagnosis not present

## 2023-07-28 MED ORDER — VITAMIN D (ERGOCALCIFEROL) 1.25 MG (50000 UNIT) PO CAPS
50000.0000 [IU] | ORAL_CAPSULE | ORAL | 0 refills | Status: DC
Start: 2023-07-28 — End: 2023-08-11
  Filled 2023-07-28: qty 5, 35d supply, fill #0

## 2023-07-28 NOTE — Progress Notes (Signed)
Office: [phone]  /  Fax: [phone]  WEIGHT SUMMARY AND BIOMETRICS  Weight Lost Since Last Visit: 0lb  Weight Gained Since Last Visit: 1lb   Vitals Temp: 97.9 F (36.6 C) BP: 117/68 Pulse Rate: 84 SpO2: 95 %   Anthropometric Measurements Height: 5\' 4"  (1.626 m) Weight: 256 lb (116.1 kg) BMI (Calculated): 43.92 Weight at Last Visit: 255lb Weight Lost Since Last Visit: 0lb Weight Gained Since Last Visit: 1lb Starting Weight: 253lb Total Weight Loss (lbs): 0 lb (0 kg)   Body Composition  Body Fat %: 51 % Fat Mass (lbs): 130.6 lbs Muscle Mass (lbs): 119 lbs Total Body Water (lbs): 89.4 lbs Visceral Fat Rating : 13   Other Clinical Data Fasting: No Labs: No Today's Visit #: 4 Starting Date: 06/09/23     HPI  Chief Complaint: OBESITY  Michelle Blevins is here to discuss her progress with her obesity treatment plan. She is on the the Category 3 Plan and states she is following her eating plan approximately 80 % of the time. She states she is exercising 60 minutes 7 days per week.   Interval History:  Since last office visit she has gained 1 pound.  She's not skipping meals. She is not weighing or measuring her food.  She is drinking water daily.    Pharmacotherapy for weight loss: She is not currently taking medications  for medical weight loss.   Previous pharmacotherapy for medical weight loss:  none  Bariatric surgery:  Patient has not had bariatric surgery.    Vit D deficiency  She is taking Vit D 50,000 IU weekly.  Denies side effects.  Denies nausea, vomiting or muscle weakness.    Lab Results  Component Value Date   VD25OH 28.7 (L) 06/09/2023   VD25OH 16.16 (L) 03/30/2023    Prediabetes Last A1c was 6.0  Medication(s): none Polyphagia:Yes FH:  father with DMT2  Lab Results  Component Value Date   HGBA1C 6.0 03/30/2023   Lab Results  Component Value Date   INSULIN 24.3 06/09/2023    PHYSICAL EXAM:  Blood pressure 117/68, pulse 84,  temperature 97.9 F (36.6 C), height 5\' 4"  (1.626 m), weight 256 lb (116.1 kg), last menstrual period 06/28/2023, SpO2 95%. Body mass index is 43.94 kg/m.  General: She is overweight, cooperative, alert, well developed, and in no acute distress. PSYCH: Has normal mood, affect and thought process.   Extremities: No edema.  Neurologic: No gross sensory or motor deficits. No tremors or fasciculations noted.    DIAGNOSTIC DATA REVIEWED:  BMET    Component Value Date/Time   NA 139 03/30/2023 0938   K 3.9 03/30/2023 0938   CL 105 03/30/2023 0938   CO2 24 03/30/2023 0938   GLUCOSE 91 03/30/2023 0938   BUN 15 03/30/2023 0938   CREATININE 0.73 03/30/2023 0938   CALCIUM 9.3 03/30/2023 0938   Lab Results  Component Value Date   HGBA1C 6.0 03/30/2023   Lab Results  Component Value Date   INSULIN 24.3 06/09/2023   Lab Results  Component Value Date   TSH 1.46 03/30/2023   CBC    Component Value Date/Time   WBC 9.2 03/30/2023 0938   RBC 5.31 (H) 03/30/2023 0938   HGB 12.1 03/30/2023 0938   HCT 38.5 03/30/2023 0938   PLT 488.0 (H) 03/30/2023 0938   MCV 72.5 (L) 03/30/2023 0938   MCHC 31.4 03/30/2023 0938   RDW 17.0 (H) 03/30/2023 0938   Iron Studies No results found for: "IRON", "  TIBC", "FERRITIN", "IRONPCTSAT" Lipid Panel     Component Value Date/Time   CHOL 147 03/30/2023 0938   TRIG 119.0 03/30/2023 0938   HDL 40.80 03/30/2023 0938   CHOLHDL 4 03/30/2023 0938   VLDL 23.8 03/30/2023 0938   LDLCALC 83 03/30/2023 0938   Hepatic Function Panel     Component Value Date/Time   PROT 7.8 03/30/2023 0938   ALBUMIN 4.4 03/30/2023 0938   AST 15 03/30/2023 0938   ALT 16 03/30/2023 0938   ALKPHOS 77 03/30/2023 0938   BILITOT 0.2 03/30/2023 0938      Component Value Date/Time   TSH 1.46 03/30/2023 0938   Nutritional Lab Results  Component Value Date   VD25OH 28.7 (L) 06/09/2023   VD25OH 16.16 (L) 03/30/2023     ASSESSMENT AND PLAN  TREATMENT PLAN FOR  OBESITY:  Recommended Dietary Goals  Michelle Blevins is currently in the action stage of change. As such, her goal is to continue weight management plan. She has agreed to the Category 3 Plan-1600-1700 calories and 80-90+ grams of protein.    Behavioral Intervention  We discussed the following Behavioral Modification Strategies today: increasing lean protein intake, decreasing simple carbohydrates , increasing vegetables, increasing lower glycemic fruits, increasing fiber rich foods, increasing water intake, reading food labels , keeping healthy foods at home, continue to practice mindfulness when eating, and planning for success.  Additional resources provided today: NA  Recommended Physical Activity Goals  Michelle Blevins has been advised to work up to 150 minutes of moderate intensity aerobic activity a week and strengthening exercises 2-3 times per week for cardiovascular health, weight loss maintenance and preservation of muscle mass.   She has agreed to Continue current level of physical activity     ASSOCIATED CONDITIONS ADDRESSED TODAY  Action/Plan  Vitamin D deficiency -     Vitamin D (Ergocalciferol); Take 1 capsule (50,000 Units total) by mouth every 7 (seven) days.  Dispense: 5 capsule; Refill: 0  Low Vitamin D level contributes to fatigue and are associated with obesity, breast, and colon cancer. She agrees to continue to take prescription Vitamin D @50 ,000 IU every week and will follow-up for routine testing of Vitamin D, at least 2-3 times per year to avoid over-replacement.   Prediabetes Michelle Blevins will continue to work on weight loss, exercise, and decreasing simple carbohydrates to help decrease the risk of diabetes.  Discussed adding Metformin.  Patient doesn't want to add any more medications at this time.     Morbid obesity (HCC)  BMI 40.0-44.9, adult (HCC)         Return in about 2 weeks (around 08/11/2023).Marland Kitchen She was informed of the importance of frequent follow up visits  to maximize her success with intensive lifestyle modifications for her multiple health conditions.   ATTESTASTION STATEMENTS:  Reviewed by clinician on day of visit: allergies, medications, problem list, medical history, surgical history, family history, social history, and previous encounter notes.     Theodis Sato. Kaien Pezzullo FNP-C

## 2023-08-09 ENCOUNTER — Other Ambulatory Visit: Payer: Self-pay

## 2023-08-11 ENCOUNTER — Ambulatory Visit (INDEPENDENT_AMBULATORY_CARE_PROVIDER_SITE_OTHER): Payer: 59 | Admitting: Bariatrics

## 2023-08-11 ENCOUNTER — Other Ambulatory Visit (HOSPITAL_COMMUNITY): Payer: Self-pay

## 2023-08-11 ENCOUNTER — Encounter: Payer: Self-pay | Admitting: Bariatrics

## 2023-08-11 VITALS — BP 121/83 | HR 94 | Temp 98.0°F | Ht 64.0 in | Wt 254.0 lb

## 2023-08-11 DIAGNOSIS — Z6841 Body Mass Index (BMI) 40.0 and over, adult: Secondary | ICD-10-CM

## 2023-08-11 DIAGNOSIS — E559 Vitamin D deficiency, unspecified: Secondary | ICD-10-CM | POA: Diagnosis not present

## 2023-08-11 DIAGNOSIS — R7303 Prediabetes: Secondary | ICD-10-CM | POA: Diagnosis not present

## 2023-08-11 MED ORDER — VITAMIN D (ERGOCALCIFEROL) 1.25 MG (50000 UNIT) PO CAPS
50000.0000 [IU] | ORAL_CAPSULE | ORAL | 0 refills | Status: DC
Start: 2023-08-11 — End: 2023-09-26
  Filled 2023-08-11: qty 5, 35d supply, fill #0

## 2023-08-11 NOTE — Progress Notes (Signed)
WEIGHT SUMMARY AND BIOMETRICS  Weight Lost Since Last Visit: 2lb  Vitals Temp: 98 F (36.7 C) BP: 121/83 Pulse Rate: 94 SpO2: 97 %   Anthropometric Measurements Height: 5\' 4"  (1.626 m) Weight: 254 lb (115.2 kg) BMI (Calculated): 43.58 Weight at Last Visit: 256lb Weight Lost Since Last Visit: 2lb Starting Weight: 253lb Total Weight Loss (lbs): 0 lb (0 kg)   Body Composition  Body Fat %: 49.5 % Fat Mass (lbs): 125.8 lbs Muscle Mass (lbs): 122 lbs Total Body Water (lbs): 91.6 lbs Visceral Fat Rating : 12   Other Clinical Data Fasting: no Labs: no Today's Visit #: 5 Starting Date: 06/09/23    OBESITY Michelle Blevins is here to discuss her progress with her obesity treatment plan along with follow-up of her obesity related diagnoses.     Nutrition Plan: the Category 3 plan - 70% adherence.  Current exercise: walking  Interim History:  She is down another 2 lbs since her last visit. She has struggled with sugar more, but is weaning.  Eating all of the food on the plan., Protein intake is as prescribed, Is not drinking sugar sweetened beverages., Is not skipping meals, Water intake is adequate., and Denies polyphagia She is drinking more water.   Pharmacotherapy: Michelle Blevins is on Vyvanse and Wellbutrin XL Adverse side effects: None Hunger is moderately controlled.  Cravings are moderately controlled.  Assessment/Plan:   1. Vitamin D deficiency Vitamin D Deficiency Vitamin D is not at goal of 50.  Most recent vitamin D level was 28.7 She is on  prescription ergocalciferol 50,000 IU weekly. Lab Results  Component Value Date   VD25OH 28.7 (L) 06/09/2023   VD25OH 16.16 (L) 03/30/2023    Plan: Refill prescription vitamin D 50,000 IU weekly.   Prediabetes Last A1c was 6.0 Last fasting insulin was 24.3 Medication(s):none  Lab Results  Component Value Date   HGBA1C 6.0 03/30/2023   Lab Results  Component Value Date   INSULIN 24.3 06/09/2023     Plan: Will minimize all refined carbohydrates both sweets and starches.  Will work on the plan and exercise.  Consider both aerobic and resistance training.  Will keep protein, water, and fiber intake high.  Aim for 7 to 9 hours of sleep nightly.  Will do more cooking at home.    Morbid Obesity: Current BMI BMI (Calculated): 43.58   Pharmacotherapy Plan Continue her medications per her PCP.   Michelle Blevins is currently in the action stage of change. As such, her goal is to continue with weight loss efforts.  She has agreed to keeping a food journal with goal of 1,600 calories and 90 to 100 grams of protein daily.  Exercise goals: For substantial health benefits, adults should do at least 150 minutes (2 hours and 30 minutes) a week of moderate-intensity, or 75 minutes (1 hour and 15 minutes) a week of vigorous-intensity aerobic physical activity, or an equivalent combination of moderate- and vigorous-intensity aerobic activity. Aerobic activity should be performed in episodes of at least 10 minutes, and preferably, it should be spread throughout the week.  Behavioral modification strategies: decrease eating out, meal planning , increase water intake, better snacking choices, planning for success, increasing vegetables, increasing fiber rich foods, get rid of junk food in the home, keep healthy foods in the home, measure portion sizes, and mindful eating.  Michelle Blevins has agreed to follow-up with our clinic in 4 weeks.       Objective:   VITALS: Per patient if applicable, see vitals.  GENERAL: Alert and in no acute distress. CARDIOPULMONARY: No increased WOB. Speaking in clear sentences.  PSYCH: Pleasant and cooperative. Speech normal rate and rhythm. Affect is appropriate. Insight and judgement are appropriate. Attention is focused, linear, and appropriate.  NEURO: Oriented as arrived to appointment on time with no prompting.   Attestation Statements:   This was prepared with the assistance  of Engineer, civil (consulting).  Occasional wrong-word or sound-a-like substitutions may have occurred due to the inherent limitations of voice recognition software.   Corinna Capra, DO

## 2023-09-13 ENCOUNTER — Ambulatory Visit: Payer: 59 | Admitting: Bariatrics

## 2023-09-26 ENCOUNTER — Ambulatory Visit: Payer: 59 | Admitting: Bariatrics

## 2023-09-26 ENCOUNTER — Other Ambulatory Visit (HOSPITAL_COMMUNITY): Payer: Self-pay

## 2023-09-26 ENCOUNTER — Encounter: Payer: Self-pay | Admitting: Bariatrics

## 2023-09-26 VITALS — BP 126/84 | HR 94 | Temp 98.0°F | Ht 64.0 in | Wt 253.0 lb

## 2023-09-26 DIAGNOSIS — E559 Vitamin D deficiency, unspecified: Secondary | ICD-10-CM | POA: Diagnosis not present

## 2023-09-26 DIAGNOSIS — E66813 Obesity, class 3: Secondary | ICD-10-CM | POA: Diagnosis not present

## 2023-09-26 DIAGNOSIS — R7303 Prediabetes: Secondary | ICD-10-CM

## 2023-09-26 DIAGNOSIS — Z6841 Body Mass Index (BMI) 40.0 and over, adult: Secondary | ICD-10-CM | POA: Diagnosis not present

## 2023-09-26 MED ORDER — VITAMIN D (ERGOCALCIFEROL) 1.25 MG (50000 UNIT) PO CAPS
50000.0000 [IU] | ORAL_CAPSULE | ORAL | 0 refills | Status: AC
  Filled 2023-09-26 – 2023-12-25 (×2): qty 5, 35d supply, fill #0

## 2023-09-27 NOTE — Progress Notes (Signed)
Chief Complaint:   OBESITY Michelle Blevins is here to discuss her progress with her obesity treatment plan along with follow-up of her obesity related diagnoses. Michelle Blevins is on keeping a food journal and adhering to recommended goals of 1600 calories and 90-100 grams of protein and states she is following her eating plan approximately 60% of the time. Michelle Blevins states she is walking for 30-60 minutes 5-7 times per week.  Today's visit was #: 6 Starting weight: 253 lbs Starting date: 06/09/2023 Today's weight: 253 lbs Today's date: 09/26/2023 Total lbs lost to date: 0 Total lbs lost since last in-office visit: 1  Interim History: Patient is down 1 pound since her last visit.  She was up about 260 pounds and she has lost weight.  She is drinking water and feeling more energy.  She is craving less sugar and more savory food.  Subjective:   1. Vitamin D deficiency Patient is taking prescription Vitamin D.   2. Prediabetes Patient notes decreased cravings and appetite at night.   Assessment/Plan:   1. Vitamin D deficiency Patient will continue prescription Vitamin D once weekly, and we will refill for 1 month.   - Vitamin D, Ergocalciferol, (DRISDOL) 1.25 MG (50000 UNIT) CAPS capsule; Take 1 capsule (50,000 Units total) by mouth every 7 (seven) days.  Dispense: 5 capsule; Refill: 0  2. Prediabetes Patient will continue to work on keeping carbohydrates low (sugar and starches), and increase vegetables and meat.   3. Class 3 severe obesity due to excess calories without serious comorbidity with body mass index (BMI) of 40.0 to 44.9 in adult [facility]) Michelle Blevins is currently in the action stage of change. As such, her goal is to continue with weight loss efforts. She has agreed to keeping a food journal and adhering to recommended goals of 1600 calories and 90-100 grams of protein daily.   Patient will adhere more closely to the meal plan.  She will eliminate carbohydrates (sweets and starches).  She  will increase her journaling.  Exercise goals: As is.   Behavioral modification strategies: increasing lean protein intake, decreasing simple carbohydrates, increasing vegetables, increasing water intake, decreasing eating out, no skipping meals, meal planning and cooking strategies, keeping healthy foods in the home, and planning for success.  Michelle Blevins has agreed to follow-up with our clinic in 4 weeks. She was informed of the importance of frequent follow-up visits to maximize her success with intensive lifestyle modifications for her multiple health conditions.   Objective:   Blood pressure 126/84, pulse 94, temperature 98 F (36.7 C), height 5\' 4"  (1.626 m), weight 253 lb (114.8 kg), SpO2 97%. Body mass index is 43.43 kg/m.  General: Cooperative, alert, well developed, in no acute distress. HEENT: Conjunctivae and lids unremarkable. Cardiovascular: Regular rhythm.  Lungs: Normal work of breathing. Neurologic: No focal deficits.   Lab Results  Component Value Date   CREATININE 0.73 03/30/2023   BUN 15 03/30/2023   NA 139 03/30/2023   K 3.9 03/30/2023   CL 105 03/30/2023   CO2 24 03/30/2023   Lab Results  Component Value Date   ALT 16 03/30/2023   AST 15 03/30/2023   ALKPHOS 77 03/30/2023   BILITOT 0.2 03/30/2023   Lab Results  Component Value Date   HGBA1C 6.0 03/30/2023   Lab Results  Component Value Date   INSULIN 24.3 06/09/2023   Lab Results  Component Value Date   TSH 1.46 03/30/2023   Lab Results  Component Value Date   CHOL  147 03/30/2023   HDL 40.80 03/30/2023   LDLCALC 83 03/30/2023   TRIG 119.0 03/30/2023   CHOLHDL 4 03/30/2023   Lab Results  Component Value Date   VD25OH 28.7 (L) 06/09/2023   VD25OH 16.16 (L) 03/30/2023   Lab Results  Component Value Date   WBC 9.2 03/30/2023   HGB 12.1 03/30/2023   HCT 38.5 03/30/2023   MCV 72.5 (L) 03/30/2023   PLT 488.0 (H) 03/30/2023   No results found for: "IRON", "TIBC", "FERRITIN"  Attestation  Statements:   Reviewed by clinician on day of visit: allergies, medications, problem list, medical history, surgical history, family history, social history, and previous encounter notes.   Trude Mcburney, am acting as Energy manager for [employer], DO.  I have reviewed the above documentation for accuracy and completeness, and I agree with the above. Corinna Capra, DO

## 2023-09-28 ENCOUNTER — Other Ambulatory Visit (HOSPITAL_COMMUNITY): Payer: Self-pay

## 2023-10-06 ENCOUNTER — Other Ambulatory Visit (HOSPITAL_COMMUNITY): Payer: Self-pay

## 2023-12-21 ENCOUNTER — Encounter: Payer: Self-pay | Admitting: Family Medicine

## 2023-12-21 ENCOUNTER — Ambulatory Visit: Payer: Commercial Managed Care - PPO | Admitting: Family Medicine

## 2023-12-21 ENCOUNTER — Other Ambulatory Visit: Payer: Self-pay

## 2023-12-21 VITALS — BP 110/84 | HR 101 | Temp 98.6°F | Ht 64.0 in | Wt 250.8 lb

## 2023-12-21 DIAGNOSIS — J069 Acute upper respiratory infection, unspecified: Secondary | ICD-10-CM

## 2023-12-21 NOTE — Progress Notes (Signed)
 Established Patient Office Visit   Subjective  Patient ID: Michelle Blevins, female    DOB: 18-Jun-2001  Age: 23 y.o. MRN: [id number]  Chief Complaint  Patient presents with   Cough    Coughing congestion and vomiting,  lungs feel tight     Patient is a 23 year old female seen for acute concern.  Patient endorses cough, posttussive emesis, nasal congestion, sore throat, rhinorrhea x 1 week.  Patient may have had a headache a day or so prior to start of symptoms.  Denies facial pain/pressure, ear pain/pressure, headache, fever, chills, sick contacts, or recent travel.  Tried NyQuil and cough medication for symptoms.  Patient feeling a little better today.  Was concerned as typically does not have cold symptoms this long.    Patient Active Problem List   Diagnosis Date Noted   Class 3 severe obesity due to excess calories without serious comorbidity with body mass index (BMI) of 40.0 to 44.9 in adult (HCC) 09/26/2023   Insulin  resistance 06/13/2023   BMI 40.0-44.9, adult (HCC) 06/09/2023   Morbid obesity (HCC) 06/09/2023   B12 deficiency 06/09/2023   Health care maintenance 06/09/2023   SOB (shortness of breath) on exertion 06/09/2023   Other fatigue 06/09/2023   Prediabetes 04/01/2023   Vitamin D  deficiency 04/01/2023   Learning difficulty 05/28/2019   patient counseled 05/28/2019   Mild persistent asthma with acute exacerbation 01/13/2019   ADHD (attention deficit hyperactivity disorder), 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   Past Medical History:  Diagnosis Date   ADD (attention deficit disorder)    ADHD (attention deficit hyperactivity disorder)    Anxiety    Asthma    Depression    Pre-diabetes    Vitamin D  deficiency    Past Surgical History:  Procedure Laterality Date   TONSILLECTOMY     Social History   Tobacco Use   Smoking status: Never   Smokeless tobacco: Never  Vaping Use   Vaping status: Never Used  Substance Use Topics   Alcohol use: No    Drug use: No   Family History  Problem Relation Age of Onset   Breast cancer Mother    Obesity Mother    Obesity Father    Diabetes Father    Allergies  Allergen Reactions   Epiduo [Adapalene-Benzoyl Peroxide] Swelling    Pt had swelling in the face from medication      ROS Negative unless stated above    Objective:     BP 110/84 (BP Location: Left Arm, Patient Position: Sitting, Cuff Size: Large)   Pulse (!) 101   Temp 98.6 F (37 C) (Oral)   Ht 5' 4 (1.626 m)   Wt 250 lb 12.8 oz (113.8 kg)   LMP 12/20/2023 (Exact Date)   SpO2 97%   BMI 43.05 kg/m  BP Readings from Last 3 Encounters:  12/21/23 110/84  09/26/23 126/84  08/11/23 121/83   Wt Readings from Last 3 Encounters:  12/21/23 250 lb 12.8 oz (113.8 kg)  09/26/23 253 lb (114.8 kg)  08/11/23 254 lb (115.2 kg)      Physical Exam Constitutional:      General: She is not in acute distress.    Appearance: Normal appearance.  HENT:     Head: Normocephalic and atraumatic.     Right Ear: Tympanic membrane and ear canal normal.     Left Ear: Tympanic membrane and ear canal normal.     Ears:     Comments: Bilateral TMs full  without erythema.    Nose: Nose normal.     Mouth/Throat:     Mouth: Mucous membranes are moist.  Eyes:     Extraocular Movements: Extraocular movements intact.     Conjunctiva/sclera: Conjunctivae normal.  Cardiovascular:     Rate and Rhythm: Normal rate and regular rhythm.     Heart sounds: Normal heart sounds. No murmur heard.    No gallop.  Pulmonary:     Effort: Pulmonary effort is normal. No respiratory distress.     Breath sounds: Normal breath sounds. No wheezing, rhonchi or rales.  Skin:    General: Skin is warm and dry.  Neurological:     Mental Status: She is alert and oriented to person, place, and time.      No results found for any visits on 12/21/23.    Assessment & Plan:  Viral URI with cough  Acute URI symptoms likely viral in etiology.  Continue  supportive care with OTC cough/cold medications, rest, hydration.  Patient advised to notify clinic for rebound or worsening symptoms in the next few days as could develop sinusitis.  Given strict precautions.  Return if symptoms worsen or fail to improve.   Michelle JONELLE Single, MD

## 2023-12-26 ENCOUNTER — Other Ambulatory Visit (HOSPITAL_COMMUNITY): Payer: Self-pay

## 2024-01-02 ENCOUNTER — Other Ambulatory Visit (HOSPITAL_COMMUNITY): Payer: Self-pay

## 2024-01-04 ENCOUNTER — Encounter: Payer: Self-pay | Admitting: Family Medicine

## 2024-01-16 ENCOUNTER — Ambulatory Visit (INDEPENDENT_AMBULATORY_CARE_PROVIDER_SITE_OTHER): Payer: Commercial Managed Care - PPO | Admitting: Psychology

## 2024-01-16 DIAGNOSIS — F902 Attention-deficit hyperactivity disorder, combined type: Secondary | ICD-10-CM | POA: Diagnosis not present

## 2024-01-16 DIAGNOSIS — F330 Major depressive disorder, recurrent, mild: Secondary | ICD-10-CM | POA: Diagnosis not present

## 2024-01-16 NOTE — Progress Notes (Unsigned)
[facility] Counselor Initial Adult Exam  Name: Michelle Blevins Date: 01/16/2024 MRN: [id number] DOB: 2001/10/11 PCP: Deeann Saint, MD  Time spent: 9:01am-10:00am  Guardian/Payee:  self    Paperwork requested: {NWG:95621}  Reason for Visit Loman Chroman Problem: self referral generally life stuff.  Help finding some resources as well.  Trying to find a job in this economy.   I having a hard time trying to  Pyschiatrist.   Pediatrician- Dr. Carmina Miller.    Mental Status Exam: Appearance:   {PSY:22683}     Behavior:  {PSY:21022743}  Motor:  {PSY:22302}  Speech/Language:   {PSY:22685}  Affect:  {PSY:22687}  Mood:  {PSY:31886}  Thought process:  {PSY:31888}  Thought content:    {PSY:971-801-4078}  Sensory/Perceptual disturbances:    {PSY:717-332-6645}  Orientation:  {PSY:30297}  Attention:  {PSY:22877}  Concentration:  {PSY:803-743-1713}  Memory:  {[phone]}  Fund of knowledge:   {PSY:803-743-1713}  Insight:    {PSY:803-743-1713}  Judgment:   {PSY:803-743-1713}  Impulse Control:  {PSY:803-743-1713}   Appearance: {PSY:22683}    Behavior: {PSY:21022743} Motor: {PSY:22302} Speech/Language: {PSY:22685} Affect: {PSY:22687} Mood: {PSY:31886} Thought process: {PSY:31888} Thought content: {PSY:971-801-4078} Sensory/Perceptual disturbances: {PSY:717-332-6645} Orientation: {PSY:30297} Attention: {PSY:22877} Concentration: {PSY:803-743-1713} Memory: {[phone]} Fund of knowledge: {PSY:803-743-1713} Insight: {PSY:803-743-1713} Judgment: {PSY:803-743-1713} Impulse Control: {PSY:803-743-1713}  Reported Symptoms:  Vyvanse.  Brain being tamped down- curtains over my brain being pulled.  But works w/ focus.  Impacted my social ability.  Social ability being muted.  Fogginess. Wellbutrin helping me w/ more social and actually speak my mind for once.   Sleep not my norm- too active,    10-12pm  around an hour to fall asleep.  Sometimes 3-4 hours to go to sleep.   A little hard but do  get up.  Very motivated if something important during day.  Less w/ writing, looking for jobs.  If not enough sleep-  anxieties and how my mom's going to react.   Mom's high expectations of me.trying to push me too hard.  I'm trying to figure out on my own.     Risk Assessment: Danger to Self:  {PSY:22692} Self-injurious Behavior: {PSY:22692} Danger to Others: {PSY:22692} Duty to Warn:{PSY:311194} Physical Aggression / Violence:{PSY:21197} Access to Firearms a concern: {PSY:21197} Gang Involvement:{PSY:21197} Patient / guardian was educated about steps to take if suicide or homicide risk level increases between visits: {Yes/No-Ex:120004} While future psychiatric events cannot be accurately predicted, the patient does not currently require acute inpatient psychiatric care and does not currently meet [facility] involuntary commitment criteria.  Substance Abuse History: Current substance abuse: {PSY:21197}    Past Psychiatric History:   Previous psychological history is significant for ADHD dx around 3rd grade.  Dx w/ depression at end of high school.   Outpatient Providers:*** History of Psych Hospitalization: {PSY:21197} Psychological Testing: {PSY:21014032}   Abuse History:  Victim of: {Abuse History:314532}, {Type of abuse:20566}   Report needed: {PSY:314532} Victim of Neglect:{yes no:314532} Perpetrator of {PSY:20566}  Witness / Exposure to Domestic Violence: {PSY:21197}  Protective Services Involvement: {PSY:21197} Witness to [employer] Violence:  {HYQ:65784}  Family History:  Family History  Problem Relation Age of Onset   Breast cancer Mother    Obesity Mother    Obesity Father    Diabetes Father   Only child.  Little to none.   Relationship w/ mom.  Tense in some spots.  But overall good.   Mom works for hospital supply chain.    Grandfather in . [place].  Once every few years.  Cousins talk w/ on  facebook messengers.   Living situation: the patient  lives with their family.  Lives w/ mom and dog.  For whole life.  Wants to move out.    Sexual Orientation: {Sexual [phone]}  Relationship Status: {Desc; marital status:62}  Name of spouse / other:*** If a parent, number of children / ages:***  Support Systems: mom, dr. Carney Bern at [employer]. Weston Brass close friend.    Financial Stress:  Yes   Income/Employment/Disability: Employment.  [name].  Socializing autism/adhd.  Internship since 2021.  After school program Fri 3 hours.  No pay.  Helping around the negihtborhood.  Out for trip- house sitting/pet sitting.  Crotchet for others.    Military Service: {ZOX:09604}  Educational History: Education: Chemical engineer course.  MRI tech program.  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  [employer].  April 2025.  Few colleges courses at [employer] live on campus and wasn't fit.  Couple [employer].  Graduated high school [employer].    Religion/Sprituality/World View: {CHL AMB RELIGION/SPIRITUALITY:[phone]}  Any cultural differences that may affect / interfere with treatment:  {Religious/Cultural:200019}  Recreation/Hobbies: crotchet.  Knitting.  Writing.  Scifi/fantasy watching shows.  Writing sci fi.    Stressors: {PATIENT STRESSORS:22669}  Strengths: {Patient Coping Strengths:[phone]}  Barriers:  ***   Legal History: Pending legal issue / charges: {PSY:20588} History of legal issue / charges: {Legal [phone]}  Medical History/Surgical History: {Desc; reviewed/not reviewed:60074} Past Medical History:  Diagnosis Date   ADD (attention deficit disorder)    ADHD (attention deficit hyperactivity disorder)    Anxiety    Asthma    Depression    Pre-diabetes    Vitamin D deficiency     Past Surgical History:  Procedure Laterality Date   TONSILLECTOMY      Medications: Current Outpatient Medications  Medication Sig Dispense Refill   Amphetamine Sulfate (EVEKEO)  10 MG TABS Take 1 tablet by mouth every evening. 30 tablet 0   buPROPion (WELLBUTRIN XL) 300 MG 24 hr tablet Take 1 tablet (300 mg total) by mouth daily. 90 tablet 1   lisdexamfetamine (VYVANSE) 60 MG capsule Take 1 capsule (60 mg total) by mouth every morning. 30 capsule 0   Multiple Vitamin (MULTI-VITAMIN DAILY PO) Take by mouth.     Vitamin D, Ergocalciferol, (DRISDOL) 1.25 MG (50000 UNIT) CAPS capsule Take 1 capsule (50,000 Units total) by mouth every 7 (seven) days. 5 capsule 0   No current facility-administered medications for this visit.    Allergies  Allergen Reactions   Epiduo [Adapalene-Benzoyl Peroxide] Swelling    Pt had swelling in the face from medication    Diagnoses:  Mild episode of recurrent major depressive disorder (HCC)  ADHD (attention deficit hyperactivity disorder), combined type  Plan of Care: *** referred to community psychiatrist for medical management.    Forde Radon, [facility]

## 2024-02-06 ENCOUNTER — Ambulatory Visit (INDEPENDENT_AMBULATORY_CARE_PROVIDER_SITE_OTHER): Payer: Commercial Managed Care - PPO | Admitting: Psychology

## 2024-02-06 DIAGNOSIS — F331 Major depressive disorder, recurrent, moderate: Secondary | ICD-10-CM | POA: Diagnosis not present

## 2024-02-06 DIAGNOSIS — F330 Major depressive disorder, recurrent, mild: Secondary | ICD-10-CM

## 2024-02-06 DIAGNOSIS — F902 Attention-deficit hyperactivity disorder, combined type: Secondary | ICD-10-CM

## 2024-02-06 NOTE — Progress Notes (Signed)
   Michelle Blevins, [facility]

## 2024-02-06 NOTE — Progress Notes (Signed)
 [facility] Counselor/Therapist Progress Note  Patient ID: Michelle Blevins, MRN: [id number],    Date: 02/06/2024  Time Spent: 9:01am-9:50am   Treatment Type: Individual Therapy  Pt is seen for a virtual video visit via caregility.  Pt joins from her home, reporting privacy, and counselor from her home office.  Pt consents to virtual visit and is aware of limitations of such visits.  Reported Symptoms: feeling lonely/empty,  applying for jobs.  Mental Status Exam: Appearance:  Well Groomed     Behavior: Appropriate  Motor: Normal  Speech/Language:  Normal Rate  Affect: Blunt  Mood: sad  Thought process: normal  Thought content:   WNL  Sensory/Perceptual disturbances:   WNL  Orientation: oriented to person, place, time/date, and situation  Attention: Good  Concentration: Good  Memory: WNL  Fund of knowledge:  Good  Insight:   Fair  Judgment:  Good  Impulse Control: Good   Risk Assessment: Danger to Self:  No Self-injurious Behavior: No Danger to Others: No Duty to Warn:no Physical Aggression / Violence:No  Access to Firearms a concern: No  Gang Involvement:No   Subjective: counselor assessed pt current functioning per pt report.  Developed tx plan w/ pt, identifying goals for counseling.  Processed w/pt loneliness and empty feelings. Discussed current connections and social opportunities.  Explored other ways of engaging through interests.  Pt affect blunt.  Pt reports doing ok.  Pt reports has a psychiatric appointment w/ Dr Stevphen Rochester this week for evaluation.  Pt reports she has applied to couple of jobs.  Pt reports on knitting projects and enjoyment from.  Pt reports feelings of loneliness and emptiness and recognizes guarded about as hurts.  Pt has only limited interactions outside of the home due to financial barriers current.  Pt receptive to exploring groups connected to her interests as way of increased social interaction.   Interventions: Cognitive Behavioral  Therapy, Solution-Oriented/Positive Psychology, and supportive  Diagnosis:Mild episode of recurrent major depressive disorder (HCC)  ADHD (attention deficit hyperactivity disorder), combined type  Plan: Pt to f/u w/ counseling in 3 weeks.  Pt to f/u w/ Dr. Stevphen Rochester as scheduled.  Individualized Treatment Plan Strengths: crotchet.  Knitting.  Writing sci fi.   Enjoys Scifi/fantasy watching shows.  Favorite podcast dungeon and dragons- and creating characters.  Positive "Mental strength to deal w/ stuff."    Supports: mom, Dr. Carney Bern at [employer] [facility] [employer] [employer]. Weston Brass close friend.     Goal/Needs for Treatment:  In order of importance to patient 1) increase and improve social interaction and communication 2) decrease feelings of emptiness and loneliness 3) ---   Client Statement of Needs: Pt:  "socialize better in general and be better at communicating".  Feels that there is a mental block in front of me. Not feel empty and lonely"  Working towards independence.     Treatment Level:outpatient counseling  Symptoms:difficulty w/ social interaction, feeling empty and lonely  Client Treatment Preferences: continue counseling 1-2 times a month.  Dr. Stevphen Rochester medication evaluations.     Healthcare consumer's goal for treatment:  Counselor, Forde Radon, [facility] will support the patient's ability to achieve the goals identified. Cognitive Behavioral Therapy, Assertive Communication/Conflict Resolution Training, Relaxation Training, ACT, Humanistic and other evidenced-based practices will be used to promote progress towards healthy functioning.   Healthcare consumer will: Actively participate in therapy, working towards healthy functioning.    *Justification for Continuation/Discontinuation of Goal: R=Revised, O=Ongoing, A=Achieved, D=Discontinued  Goal 1) Increase effective communication skills and positive social interactions AEB pt  report and therapist observation. Baseline date 02/06/24: Progress towards  goal 0; How Often - Daily Target Date Goal Was reviewed Status Code Progress towards goal/Likert rating  02/05/25                Goal 2) Decrease depressed mood, loneliness and empty feelings AEB pt report and therapist observation. Baseline date 02/06/24: Progress towards goal 0; How Often - Daily Target Date Goal Was reviewed Status Code Progress towards goal  02/05/25                  This plan has been reviewed and created by the following participants:  This plan will be reviewed at least every 12 months. Date Behavioral Health Clinician Date Guardian/Patient   02/06/24 [facility] Ophelia [facility] 02/06/24 Verbal Consent Provided                    Forde Radon [facility]

## 2024-02-07 ENCOUNTER — Encounter: Payer: Self-pay | Admitting: Family Medicine

## 2024-02-08 NOTE — Telephone Encounter (Signed)
 Called patient to sch an appt per Dr. Salomon Fick to fill out paperwork. Left a Vm to return call

## 2024-02-09 ENCOUNTER — Other Ambulatory Visit (HOSPITAL_COMMUNITY): Payer: Self-pay

## 2024-02-09 ENCOUNTER — Ambulatory Visit (INDEPENDENT_AMBULATORY_CARE_PROVIDER_SITE_OTHER): Payer: Commercial Managed Care - PPO | Admitting: Psychiatry

## 2024-02-09 ENCOUNTER — Encounter: Payer: Self-pay | Admitting: Psychiatry

## 2024-02-09 VITALS — BP 159/91 | HR 114 | Ht 65.0 in | Wt 258.0 lb

## 2024-02-09 DIAGNOSIS — F902 Attention-deficit hyperactivity disorder, combined type: Secondary | ICD-10-CM

## 2024-02-09 DIAGNOSIS — F3341 Major depressive disorder, recurrent, in partial remission: Secondary | ICD-10-CM | POA: Diagnosis not present

## 2024-02-09 DIAGNOSIS — F411 Generalized anxiety disorder: Secondary | ICD-10-CM

## 2024-02-09 MED ORDER — LISDEXAMFETAMINE DIMESYLATE 50 MG PO CAPS
50.0000 mg | ORAL_CAPSULE | ORAL | 0 refills | Status: DC
Start: 2024-02-09 — End: 2024-03-19
  Filled 2024-02-09: qty 30, 30d supply, fill #0

## 2024-02-09 MED ORDER — BUPROPION HCL ER (XL) 300 MG PO TB24
300.0000 mg | ORAL_TABLET | Freq: Every day | ORAL | 1 refills | Status: DC
  Filled 2024-02-09: qty 90, 90d supply, fill #0
  Filled 2024-05-05: qty 90, 90d supply, fill #1

## 2024-02-09 NOTE — Progress Notes (Signed)
 [facility] [address], [place]   New patient visit Date of Service: 02/09/2024  Referral Source: self History From: patient, chart review    New Patient Appointment     Michelle Blevins is a 23 y.o. female with a history significant for ADHD, anxiety, depression. Patient is currently taking the following medications:  - Wellbutrin XL 300mg  daily - Vyvanse 60mg  daily _______________________________________________________________  Michelle Blevins presents to clinic alone for her visit.  She reports that she is here because her provider retired. She reports a history of depression dating back to when she was around 23 years old. At that time she denies any inciting event, but feels that her depression started then and has been present on and off since that time. She will go through periods of deep depressions, but is able to get out of these periods as well. She feels that for the past month or more her mood has been pretty good. She has been on Wellbutrin for about a year or more, and feels that it has been helpful overall. She does notice some "foggy" headed periods while on this medicine, but feels it's manageable currently. When depressed she isolated, feels low, feel dissociated at times, has low energy, low motivation. She denies any history of self harm or suicide attempts, and denies any SI at this time.  Michelle Blevins also reports a history of anxiety, dating back to around 23 years old as well. She states that at that time she was worried about everything. She constantly worried about her daily life, social interactions, school, etc. She often felt that the world was going to end due to how bad her anxiety was. She feels that her anxiety has improved some over the years. IT is still present today, but is much less intense and much less severe now. She has had some panic attacks, but denies these happening regularly. She mostly worries about social interactions  now.  She reports ADHD as a previous diagnosis also. This was diagnosed as a child. She reports having both inattentive and hyperactive symptoms then. She takes Vyvanse, which does seem to help, but causes her to feel too zoned in on things, and causes her to feel out of her body. She is okay with trying a lower dose of this medicine prior to any other changes.      Current suicidal/homicidal ideations: denied Current auditory/visual hallucinations: denied Sleep: difficulty falling asleep Appetite: Stable Depression: see HPI Bipolar symptoms: denies ASD: denies previous diagnosis Encopresis/Enuresis: denies Tic: denies Generalized Anxiety Disorder: see HPI Other anxiety: see HPI Obsessions and Compulsions: denies Trauma/Abuse: denies ADHD: see HPI  ROS     Current Outpatient Medications:    Amphetamine Sulfate (EVEKEO) 10 MG TABS, Take 1 tablet by mouth every evening., Disp: 30 tablet, Rfl: 0   buPROPion (WELLBUTRIN XL) 300 MG 24 hr tablet, Take 1 tablet (300 mg total) by mouth daily., Disp: 90 tablet, Rfl: 1   lisdexamfetamine (VYVANSE) 50 MG capsule, Take 1 capsule (50 mg total) by mouth every morning., Disp: 30 capsule, Rfl: 0   Multiple Vitamin (MULTI-VITAMIN DAILY PO), Take by mouth., Disp: , Rfl:    Vitamin D, Ergocalciferol, (DRISDOL) 1.25 MG (50000 UNIT) CAPS capsule, Take 1 capsule (50,000 Units total) by mouth every 7 (seven) days., Disp: 5 capsule, Rfl: 0   Allergies  Allergen Reactions   Epiduo [Adapalene-Benzoyl Peroxide] Swelling    Pt had swelling in the face from medication      Psychiatric History:  Previous diagnoses/symptoms: ADHD, depression, anxiety Non-Suicidal Self-Injury: denies Suicide Attempt History: denies Violence History: denies  Current psychiatric provider: previous provider moved Psychotherapy: Forde Radon Previous psychiatric medication trials:  [facility] Psychiatric hospitalizations: denies History of trauma/abuse:  denies    Past Medical History:  Diagnosis Date   ADD (attention deficit disorder)    ADHD (attention deficit hyperactivity disorder)    Anxiety    Asthma    Depression    Pre-diabetes    Vitamin D deficiency     History of head trauma? No History of seizures?  No     Substance use reviewed with pt, with pertinent items below: denies  History of substance/alcohol abuse treatment: denies     Family psychiatric history: denies   Family history of suicide? denies    Current Living Situation (including members of house hold): lives with mom Other family and supports: endorsed Hobbies: reading, knitting Peer relationships: endorsed Sexual Activity:  not explored Legal History:  denies  Religion/Spirituality: yes Access to Guns: denies  Looking into a work study program for MRI   Labs:  reviewed   Mental Status Examination:  Psychiatric Specialty Exam: Blood pressure (!) 159/91, pulse (!) 114, height 5\' 5"  (1.651 m), weight 258 lb (117 kg).Body mass index is 42.93 kg/m.  General Appearance: Neat and Well Groomed  Eye Contact:  Good  Speech:  Clear and Coherent and Normal Rate  Mood:  Anxious  Affect:  Appropriate  Thought Process:  Goal Directed  Orientation:  Full (Time, Place, and Person)  Thought Content:  Logical  Suicidal Thoughts:  No  Homicidal Thoughts:  No  Memory:  Immediate;   Good  Judgement:  Good  Insight:  Good  Psychomotor Activity:  Normal  Concentration:  Concentration: Fair  Recall:  Good  Fund of Knowledge:  Good  Language:  Good  Cognition:  WNL     Assessment   Psychiatric Diagnoses:   ICD-10-CM   1. ADHD (attention deficit hyperactivity disorder), combined type  F90.2     2. Generalized anxiety disorder  F41.1     3. MDD (major depressive disorder), recurrent, in partial remission (HCC)  F33.41        Medical Diagnoses: Patient Active Problem List   Diagnosis Date Noted   Class 3 severe obesity due to excess  calories without serious comorbidity with body mass index (BMI) of 40.0 to 44.9 in adult (HCC) 09/26/2023   Insulin resistance 06/13/2023   BMI 40.0-44.9, adult (HCC) 06/09/2023   Morbid obesity (HCC) 06/09/2023   B12 deficiency 06/09/2023   Health care maintenance 06/09/2023   SOB (shortness of breath) on exertion 06/09/2023   Other fatigue 06/09/2023   Prediabetes 04/01/2023   Vitamin D deficiency 04/01/2023   Learning difficulty 05/28/2019   patient counseled 05/28/2019   Mild persistent asthma with acute exacerbation 01/13/2019   ADHD (attention deficit hyperactivity disorder), 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     Medical Decision Making: Moderate  Collyn Selk is a 23 y.o. female with a history detailed above.   On evaluation Tessi has symptoms consistent with ADHD, depression, and anxiety. Her depression has been present for about 10 years, with a relapsing and remitting pattern. When depressed she has low motivation, low energy, dissociations, anhedonia, and low feelings of self worth. Lately her mood appears to be fairly stable, with Wellbutrin providing apparent benefit. Her anxiety has also been present for many years. It has gradually improved with time, but includes worries about  most things when at its worst. When younger she would worry about everything most of the time. Her anxiety remains present to a small degree and continues to improve.  Her ADHD was also diagnosed in her childhood. She endorses a combination of hyperactive and inattentive symptoms. Vyvanse has provided benefit, but does appear to have a fair amount of side effects. She reports feeling tunnel focus on this, feels that she is "out of her body' when she takes this medicine. We will plan on lowering this and will monitor how she does on a lower dose.  No SI/Hi/AVH.  There are no identified acute safety concerns. Continue outpatient level of care.     Plan  Medication management:  -  Continue Wellbutrin XL 300mg  daily  - Decrease Vyvanse to 50mg  daily for ADHD  Labs/Studies:  - none  Additional recommendations:  - Continue with current therapist, Crisis plan reviewed and patient verbally contracts for safety. Go to ED with emergent symptoms or safety concerns, and Risks, benefits, side effects of medications, including any / all black box warnings, discussed with patient, who verbalizes their understanding   Follow Up: Return in 1 month - Call in the interim for any side-effects, decompensation, questions, or problems between now and the next visit.   I have spend 60 minutes reviewing the patients chart, meeting with the patient and family, and reviewing medications and potential side effects for their condition of depression, anxiety, ADHD.  Kendal Hymen, MD [facility]

## 2024-02-15 ENCOUNTER — Encounter: Payer: Self-pay | Admitting: Family Medicine

## 2024-02-15 ENCOUNTER — Ambulatory Visit: Admitting: Family Medicine

## 2024-02-15 ENCOUNTER — Telehealth: Payer: Self-pay

## 2024-02-15 VITALS — BP 116/82 | HR 112 | Temp 98.5°F | Ht 65.0 in | Wt 260.0 lb

## 2024-02-15 DIAGNOSIS — Z0279 Encounter for issue of other medical certificate: Secondary | ICD-10-CM

## 2024-02-15 DIAGNOSIS — Z23 Encounter for immunization: Secondary | ICD-10-CM | POA: Diagnosis not present

## 2024-02-15 DIAGNOSIS — Z111 Encounter for screening for respiratory tuberculosis: Secondary | ICD-10-CM

## 2024-02-15 DIAGNOSIS — Z029 Encounter for administrative examinations, unspecified: Secondary | ICD-10-CM

## 2024-02-15 DIAGNOSIS — S39012A Strain of muscle, fascia and tendon of lower back, initial encounter: Secondary | ICD-10-CM | POA: Diagnosis not present

## 2024-02-15 DIAGNOSIS — M545 Low back pain, unspecified: Secondary | ICD-10-CM

## 2024-02-15 NOTE — Patient Instructions (Signed)
 The order for QuantiFERON gold TB test was placed.  You can set up a lab appointment to have this drawn at your convenience.  Continue using heat, ibuprofen as needed, or topical medication such as Biofreeze, IcyHot, Aspercreme for your back.  You should notice symptoms slowly improving each day.  Back strain can take 4-6 weeks to fully resolve.

## 2024-02-15 NOTE — Telephone Encounter (Signed)
 Patient has sent over school form, from has been given to patient during appt waiting TB screening, form has been sent to scan

## 2024-02-15 NOTE — Progress Notes (Signed)
 Established Patient Office Visit   Subjective  Patient ID: Michelle Blevins, female    DOB: [birth year]  Age: 23 y.o. MRN: [id number]  Chief Complaint  Patient presents with   Back Pain    Started 2 weeks ago, lower back and left hip pain, but has been getting better, dull, burn, rate of pain 4 out of 10      Patient is a 23 year old female seen for acute concern and paperwork.  Patient has immunization forms for school for completion.  States would like to wait on TB testing as it can be done closer to clinicals with actual patients.  Patient endorses left-sided low back pain x 2 weeks.  Patient woke up with symptoms.  Pain noted as a constant discomfort without radiation.  Patient concerned she pinched a nerve.  Prior to start of back pain pt started working out/exercising more than she normally does.  Tried ibuprofen for symptoms.  Back pain better.  Notices a slight numb sensation in low back.  Denies cramping sensation, loss of bowel or bladder, fever, LE weakness or edema.    ROS Negative unless stated above    Objective:     BP 116/82 (BP Location: Left Arm, Patient Position: Sitting, Cuff Size: Large)   Pulse (!) 112   Temp 98.5 F (36.9 C) (Oral)   Ht 5\' 5"  (1.651 m)   Wt 260 lb (117.9 kg)   LMP 02/09/2024 (Exact Date)   SpO2 97%   BMI 43.27 kg/m     Physical Exam Constitutional:      General: She is not in acute distress.    Appearance: Normal appearance.  HENT:     Head: Normocephalic and atraumatic.     Nose: Nose normal.     Mouth/Throat:     Mouth: Mucous membranes are moist.  Cardiovascular:     Rate and Rhythm: Normal rate and regular rhythm.     Heart sounds: Normal heart sounds. No murmur heard.    No gallop.  Pulmonary:     Effort: Pulmonary effort is normal. No respiratory distress.     Breath sounds: Normal breath sounds. No wheezing, rhonchi or rales.  Musculoskeletal:       Arms:     Cervical back: Normal.     Thoracic back: Normal.      Lumbar back: Tenderness present.  Skin:    General: Skin is warm and dry.  Neurological:     Mental Status: She is alert and oriented to person, place, and time.      No results found for any visits on 02/15/24.    Assessment & Plan:  Acute left-sided low back pain without sciatica  Strain of muscle, fascia and tendon of lower back, initial encounter  Administrative encounter  Need for TB  test -     QuantiFERON-TB Gold Plus; Future  Need for Tdap vaccination  Pt with acute L sided low back pain x 2 wks, no red flag signs.  Symptoms improving.  Likely 2/2 muscle strain/overuse.  Discussed the importance of stretching.  Continue supportive care including heat, ice, massage, Tylenol or NSAIDs, topical analgesics such as Biofreeze, IcyHot, Aspercreme.  Given stretching exercises.  Given precautions for continued or worsening symptoms.  Health forms for school completed.  Patient wishes to wait on TB testing.  Order for QuantiFERON gold placed.  Patient advised form completed with the exception of that section.  Patient given a copy.  Tdap vaccine given this visit.  Return  if symptoms worsen or fail to improve.   Deeann Saint, MD

## 2024-02-29 ENCOUNTER — Ambulatory Visit: Payer: Commercial Managed Care - PPO | Admitting: Psychology

## 2024-02-29 DIAGNOSIS — F902 Attention-deficit hyperactivity disorder, combined type: Secondary | ICD-10-CM | POA: Diagnosis not present

## 2024-02-29 DIAGNOSIS — F411 Generalized anxiety disorder: Secondary | ICD-10-CM

## 2024-02-29 DIAGNOSIS — F3341 Major depressive disorder, recurrent, in partial remission: Secondary | ICD-10-CM

## 2024-02-29 NOTE — Progress Notes (Signed)
 [facility] Counselor/Therapist Progress Note  Patient ID: Michelle Blevins, MRN: [id number],    Date: 02/29/2024  Time Spent: 12:01pm-12:46pm   Treatment Type: Individual Therapy  Pt is seen for a virtual video visit via caregility.  Pt joins from her home, reporting privacy, and counselor from her home office.  Pt consents to virtual visit and is aware of limitations of such visits.  Reported Symptoms: feeling increased connecting w/ online communities and reports only couple depressed mood days,  worry re: sleep routine   Mental Status Exam: Appearance:  Well Groomed     Behavior: Appropriate  Motor: Normal  Speech/Language:  Normal Rate  Affect: Blunt  Mood: anxious  Thought process: normal  Thought content:   WNL  Sensory/Perceptual disturbances:   WNL  Orientation: oriented to person, place, time/date, and situation  Attention: Good  Concentration: Good  Memory: WNL  Fund of knowledge:  Good  Insight:   Fair  Judgment:  Good  Impulse Control: Good   Risk Assessment: Danger to Self:  No Self-injurious Behavior: No Danger to Others: No Duty to Warn:no Physical Aggression / Violence:No  Access to Firearms a concern: No  Gang Involvement:No   Subjective: counselor assessed pt current functioning per pt report.  Processed w/pt moods, improvement and coping skills. Discussed ways pt has been connecting w/ community and positive of.  Explored pt concern and worry for sleep routine and want to change.  Discussed contributing factors and plan for change.    Pt affect blunt.  Pt reports mood is improving.  Pt reports she has only couple days of depressed and overcoming.  Pt reports she has joined some only groups related to interest online and feeling more connection.  Pt reports enjoying writing a fan fiction for one group.  Pt reported seen Dr Stevphen Rochester and taking meds as prescribed.  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  Pt expressed worry that  only falls asleep after mom and would like to change.  Pt was able to identify that worries if falls asleep before, mom may need something so finds self waiting.  Pt is able to challenge this and identify meeting her needs and plan for routine to assist.    Interventions: Cognitive Behavioral Therapy, Solution-Oriented/Positive Psychology, and supportive  Diagnosis:MDD (major depressive disorder), recurrent, in partial remission (HCC)  Generalized anxiety disorder  ADHD (attention deficit hyperactivity disorder), combined type  Plan: Pt to f/u w/ counseling in 4 weeks.  Pt to f/u w/ Dr. Stevphen Rochester as scheduled.  Individualized Treatment Plan Strengths: crotchet.  Knitting.  Writing sci fi.   Enjoys Scifi/fantasy watching shows.  Favorite podcast dungeon and dragons- and creating characters.  Positive "Mental strength to deal w/ stuff."    Supports: mom, Dr. Carney Bern at [facility] quest. Weston Brass close friend.     Goal/Needs for Treatment:  In order of importance to patient 1) increase and improve social interaction and communication 2) decrease feelings of emptiness and loneliness 3) ---   Client Statement of Needs: Pt:  "socialize better in general and be better at communicating".  Feels that there is a mental block in front of me. Not feel empty and lonely"  Working towards independence.     Treatment Level:outpatient counseling  Symptoms:difficulty w/ social interaction, feeling empty and lonely  Client Treatment Preferences: continue counseling 1-2 times a month.  Dr. Stevphen Rochester medication evaluations.     Healthcare consumer's goal for treatment:  Counselor, Forde Radon, [facility] will support  the patient's ability to achieve the goals identified. Cognitive Behavioral Therapy, Assertive Communication/Conflict Resolution Training, Relaxation Training, ACT, Humanistic and other evidenced-based practices will be used to promote progress towards healthy functioning.   Healthcare consumer will: Actively  participate in therapy, working towards healthy functioning.    *Justification for Continuation/Discontinuation of Goal: R=Revised, O=Ongoing, A=Achieved, D=Discontinued  Goal 1) Increase effective communication skills and positive social interactions AEB pt report and therapist observation. Baseline date 02/06/24: Progress towards goal 0; How Often - Daily Target Date Goal Was reviewed Status Code Progress towards goal/Likert rating  02/05/25                Goal 2) Decrease depressed mood, loneliness and empty feelings AEB pt report and therapist observation. Baseline date 02/06/24: Progress towards goal 0; How Often - Daily Target Date Goal Was reviewed Status Code Progress towards goal  02/05/25                  This plan has been reviewed and created by the following participants:  This plan will be reviewed at least every 12 months. Date Behavioral Health Clinician Date Guardian/Patient   02/06/24 [facility] Ophelia Charter [facility] 02/06/24 Verbal Consent Provided                       Forde Radon [facility]

## 2024-03-19 ENCOUNTER — Ambulatory Visit (INDEPENDENT_AMBULATORY_CARE_PROVIDER_SITE_OTHER): Payer: Commercial Managed Care - PPO | Admitting: Psychiatry

## 2024-03-19 ENCOUNTER — Other Ambulatory Visit (HOSPITAL_COMMUNITY): Payer: Self-pay

## 2024-03-19 ENCOUNTER — Encounter: Payer: Self-pay | Admitting: Psychiatry

## 2024-03-19 DIAGNOSIS — F3341 Major depressive disorder, recurrent, in partial remission: Secondary | ICD-10-CM

## 2024-03-19 DIAGNOSIS — F902 Attention-deficit hyperactivity disorder, combined type: Secondary | ICD-10-CM

## 2024-03-19 DIAGNOSIS — F411 Generalized anxiety disorder: Secondary | ICD-10-CM | POA: Diagnosis not present

## 2024-03-19 MED ORDER — LISDEXAMFETAMINE DIMESYLATE 50 MG PO CAPS: 50.0000 mg | ORAL_CAPSULE | Freq: Every day | ORAL | 0 refills | Status: AC

## 2024-03-19 MED ORDER — LISDEXAMFETAMINE DIMESYLATE 50 MG PO CAPS
50.0000 mg | ORAL_CAPSULE | Freq: Every day | ORAL | 0 refills | Status: AC
Start: 2024-04-18 — End: 2024-06-07
  Filled 2024-05-05: qty 30, 30d supply, fill #0

## 2024-03-19 MED ORDER — LISDEXAMFETAMINE DIMESYLATE 50 MG PO CAPS
50.0000 mg | ORAL_CAPSULE | Freq: Every day | ORAL | 0 refills | Status: AC
Start: 2024-03-19 — End: 2024-04-20
  Filled 2024-03-19: qty 30, 30d supply, fill #0

## 2024-03-19 NOTE — Progress Notes (Signed)
 [facility] [address], [place]   Follow-up visit  Date of Service: 03/19/2024  CC/Purpose: Routine medication management follow up.    Michelle Blevins is a 23 y.o. female with a past psychiatric history of depression, anxiety, ADHD who presents today for a psychiatric follow up appointment.    The patient was last seen on 02/09/24, at which time the following plan was established:  Medication management:             - Continue Wellbutrin XL 300mg  daily             - Decrease Vyvanse to 50mg  daily for ADHD _______________________________________________________________________________________ Acute events/encounters since last visit: none    Michelle Blevins presents to clinic alone. She states that things have been going pretty well for her. She has been taking her medicine as prescribed and feels that things are okay. She noticed improvement in her thoughts and feeling out of it when we lowered her Vyvanse. She feels that this is a good dose currently, and is okay with staying on these doses for now. NO SI/HI/AVH.    Sleep: difficulty falling asleep Appetite: Stable Depression: denies Bipolar symptoms:  denies Current suicidal/homicidal ideations:  denied Current auditory/visual hallucinations:  denied    Non-Suicidal Self-Injury: denies Suicide Attempt History: denies  Psychotherapy: Forde Radon Previous psychiatric medication trials:  [facility]    Looking into a work study program for MRI  Current Living Situation (including members of house hold): lives with mom     Allergies  Allergen Reactions   Epiduo [Adapalene-Benzoyl Peroxide] Swelling    Pt had swelling in the face from medication      Labs:  reviewed  Medical diagnoses: Patient Active Problem List   Diagnosis Date Noted   Class 3 severe obesity due to excess calories without serious comorbidity with body mass index (BMI) of 40.0 to 44.9 in adult (HCC) 09/26/2023    Insulin resistance 06/13/2023   BMI 40.0-44.9, adult (HCC) 06/09/2023   Morbid obesity (HCC) 06/09/2023   B12 deficiency 06/09/2023   Health care maintenance 06/09/2023   SOB (shortness of breath) on exertion 06/09/2023   Other fatigue 06/09/2023   Prediabetes 04/01/2023   Vitamin D deficiency 04/01/2023   Learning difficulty 05/28/2019   patient counseled 05/28/2019   Mild persistent asthma with acute exacerbation 01/13/2019   ADHD (attention deficit hyperactivity disorder), 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  General Appearance: Neat and Well Groomed  Eye Contact:  Good  Speech:  Clear and Coherent and Normal Rate  Mood:  Euthymic  Affect:  Appropriate and Congruent  Thought Process:  Coherent and Goal Directed  Orientation:  Full (Time, Place, and Person)  Thought Content:  Logical  Suicidal Thoughts:  No  Homicidal Thoughts:  No  Memory:  Immediate;   Good  Judgement:  Good  Insight:  Good  Psychomotor Activity:  Normal  Concentration:  Concentration: Good  Recall:  Good  Fund of Knowledge:  Good  Language:  Good  Assets:  Communication Skills Desire for Improvement Financial Resources/Insurance Housing Leisure Time Physical Health Resilience Social Support Talents/Skills Transportation Vocational/Educational  Cognition:  WNL      Assessment   Psychiatric Diagnoses:   ICD-10-CM   1. MDD (major depressive disorder), recurrent, in partial remission (HCC)  F33.41     2. Generalized  anxiety disorder  F41.1     3. ADHD (attention deficit hyperactivity disorder), combined type  F90.2       Patient complexity: Moderate   Patient Education and Counseling:  Supportive therapy provided for identified psychosocial stressors.  Medication education provided and decisions regarding medication regimen discussed with  patient/guardian.   On assessment today, Michelle Blevins has shown a positive response to the lower dose of Vyvanse. Her focus and thoughts feel improved with less confused and less difficulty. Her mood appears stable at this time as well. We will not adjust her regimen at this time. No SI/HI/AVH.    Plan  Medication management:  - Continue Wellbutrin XL 300mg  daily  - Continue Vyvanse 50mg  daily  Labs/Studies:  - none today  Additional recommendations:  - Continue with current therapist, Crisis plan reviewed and patient verbally contracts for safety. Go to ED with emergent symptoms or safety concerns, and Risks, benefits, side effects of medications, including any / all black box warnings, discussed with patient, who verbalizes their understanding   Follow Up: Return in 3 months - Call in the interim for any side-effects, decompensation, questions, or problems between now and the next visit.   I have spent 20 minutes reviewing the patients chart, meeting with the patient and family, and reviewing medicines and side effects.   Michelle Hymen, MD [facility]

## 2024-03-27 ENCOUNTER — Ambulatory Visit (INDEPENDENT_AMBULATORY_CARE_PROVIDER_SITE_OTHER): Admitting: Psychology

## 2024-03-27 DIAGNOSIS — F3341 Major depressive disorder, recurrent, in partial remission: Secondary | ICD-10-CM

## 2024-03-27 DIAGNOSIS — F902 Attention-deficit hyperactivity disorder, combined type: Secondary | ICD-10-CM | POA: Diagnosis not present

## 2024-03-27 DIAGNOSIS — F411 Generalized anxiety disorder: Secondary | ICD-10-CM

## 2024-03-27 NOTE — Progress Notes (Signed)
 [facility] Counselor/Therapist Progress Note  Patient ID: Michelle Blevins, MRN: [id number],    Date: 03/27/2024  Time Spent: 11:01am-11:52am   Treatment Type: Individual Therapy  Pt is seen for a virtual video visit via caregility.  Pt joins from her home, reporting privacy, and counselor from her home office.  Pt consents to virtual visit and is aware of limitations of such visits.  Reported Symptoms: pt reports sleep improved.  Pt reports connecting w/ online knitting community.  Pt reports anxiety when in public.  Mental Status Exam: Appearance:  Well Groomed     Behavior: Appropriate  Motor: Normal  Speech/Language:  Normal Rate  Affect: Blunt  Mood: anxious  Thought process: normal  Thought content:   WNL  Sensory/Perceptual disturbances:   WNL  Orientation: oriented to person, place, time/date, and situation  Attention: Good  Concentration: Good  Memory: WNL  Fund of knowledge:  Good  Insight:   Fair  Judgment:  Good  Impulse Control: Good   Risk Assessment: Danger to Self:  No Self-injurious Behavior: No Danger to Others: No Duty to Warn:no Physical Aggression / Violence:No  Access to Firearms a concern: No  Gang Involvement:No   Subjective: counselor assessed pt current functioning per pt report.  Processed w/pt positives and stressors.  Explored anxiety pt experiencing and related distortions and reframes. Discussed positives of connecting w/ community and potential for connection in local community.   Pt affect blunt.  Pt reports sleep improved w/ new routine.  Pt reports she is not feeling depressed.  Pt reports she has continued w/ online community of knitters.  Pt receptive to potential of group in community that can meet up w/. Pt shares some anxiety about going out by self at night- what if someone following me etc.  Pt identifies some distortions and discussed some steps for personal safety and reframing what if thoughts.  Pt shared potential of  move if mom accepts job out of state.  Pt discussed how could be positive and ot seeing change as negative .   Interventions: Cognitive Behavioral Therapy, Solution-Oriented/Positive Psychology, and supportive  Diagnosis:MDD (major depressive disorder), recurrent, in partial remission (HCC)  Generalized anxiety disorder  ADHD (attention deficit hyperactivity disorder), combined type  Plan: Pt to f/u w/ counseling in 4 weeks.  Pt to f/u w/ Dr. Stevphen Rochester as scheduled.  Individualized Treatment Plan Strengths: crotchet.  Knitting.  Writing sci fi.   Enjoys Scifi/fantasy watching shows.  Favorite podcast dungeon and dragons- and creating characters.  Positive "Mental strength to deal w/ stuff."    Supports: mom, Dr. Carney Bern at [facility] quest. Weston Brass close friend.     Goal/Needs for Treatment:  In order of importance to patient 1) increase and improve social interaction and communication 2) decrease feelings of emptiness and loneliness 3) ---   Client Statement of Needs: Pt:  "socialize better in general and be better at communicating".  Feels that there is a mental block in front of me. Not feel empty and lonely"  Working towards independence.     Treatment Level:outpatient counseling  Symptoms:difficulty w/ social interaction, feeling empty and lonely  Client Treatment Preferences: continue counseling 1-2 times a month.  Dr. Stevphen Rochester medication evaluations.     Healthcare consumer's goal for treatment:  Counselor, Forde Radon, [facility] will support the patient's ability to achieve the goals identified. Cognitive Behavioral Therapy, Assertive Communication/Conflict Resolution Training, Relaxation Training, ACT, Humanistic and other evidenced-based practices will be used to promote progress towards healthy functioning.  Healthcare consumer will: Actively participate in therapy, working towards healthy functioning.    *Justification for Continuation/Discontinuation of Goal: R=Revised, O=Ongoing,  A=Achieved, D=Discontinued  Goal 1) Increase effective communication skills and positive social interactions AEB pt report and therapist observation. Baseline date 02/06/24: Progress towards goal 0; How Often - Daily Target Date Goal Was reviewed Status Code Progress towards goal/Likert rating  02/05/25                Goal 2) Decrease depressed mood, loneliness and empty feelings AEB pt report and therapist observation. Baseline date 02/06/24: Progress towards goal 0; How Often - Daily Target Date Goal Was reviewed Status Code Progress towards goal  02/05/25                  This plan has been reviewed and created by the following participants:  This plan will be reviewed at least every 12 months. Date Behavioral Health Clinician Date Guardian/Patient   02/06/24 [facility] Murrel Arnt [facility] 02/06/24 Verbal Consent Provided                     Clydie Darter LCM[facility]

## 2024-04-24 ENCOUNTER — Ambulatory Visit (INDEPENDENT_AMBULATORY_CARE_PROVIDER_SITE_OTHER): Admitting: Psychology

## 2024-04-24 DIAGNOSIS — F411 Generalized anxiety disorder: Secondary | ICD-10-CM | POA: Diagnosis not present

## 2024-04-24 DIAGNOSIS — F902 Attention-deficit hyperactivity disorder, combined type: Secondary | ICD-10-CM | POA: Diagnosis not present

## 2024-04-24 DIAGNOSIS — F3341 Major depressive disorder, recurrent, in partial remission: Secondary | ICD-10-CM | POA: Diagnosis not present

## 2024-04-24 NOTE — Progress Notes (Signed)
 [facility] Counselor/Therapist Progress Note  Patient ID: Michelle Blevins, MRN: [id number],    Date: 04/24/2024  Time Spent: 10:00am-10:43am   Treatment Type: Individual Therapy  Pt is seen for a virtual video visit via caregility.  Pt joins from her home, reporting privacy, and counselor from her home office.  Pt consents to virtual visit and is aware of limitations of such visits.  Reported Symptoms: pt reports some increased feeling anxious w/ school starting.  Pt does feel some overwhelmed.   Mental Status Exam: Appearance:  Well Groomed     Behavior: Appropriate  Motor: Normal  Speech/Language:  Normal Rate  Affect: Blunt  Mood: anxious  Thought process: normal  Thought content:   WNL  Sensory/Perceptual disturbances:   WNL  Orientation: oriented to person, place, time/date, and situation  Attention: Good  Concentration: Good  Memory: WNL  Fund of knowledge:  Good  Insight:   Fair  Judgment:  Good  Impulse Control: Good   Risk Assessment: Danger to Self:  No Self-injurious Behavior: No Danger to Others: No Duty to Warn:no Physical Aggression / Violence:No  Access to Firearms a concern: No  Gang Involvement:No   Subjective: counselor assessed pt current functioning per pt report.  Processed w/pt new transitions and emotions.  Validated and normalized feelings w/ transitions.  Discussed routine and plan for class and workload.  Explored self care for relaxation.  Identified w/ pt things that are important to her to be part of.   Pt affect blunt.  Pt reports she started her MRI program on 04/16/24 and feeling of anxiety about.  Pt unable to identify any particular worry but just a lot going on and to keep track of.  Pt has identified a schedule for self outside of class time to work on school and aware may need to adjust.  Pt reports that feeling that a lot going on in the world and would like to disappear on adventure. Pt recognizes things that important to her  being part of helping others, her fun Friday group and being more independent.  Pt recognizes that taking step of school give opportunity for her.  Pt discussed knitting as positive for her still to relax. Pt denies any SI.    Interventions: Cognitive Behavioral Therapy, Solution-Oriented/Positive Psychology, and supportive  Diagnosis:MDD (major depressive disorder), recurrent, in partial remission (HCC)  Generalized anxiety disorder  ADHD (attention deficit hyperactivity disorder), combined type  Plan: Pt to f/u w/ counseling in 4 weeks.  Pt to f/u w/ Dr. Sheria Dills as scheduled.  Individualized Treatment Plan Strengths: crotchet.  Knitting.  Writing sci fi.   Enjoys Scifi/fantasy watching shows.  Favorite podcast dungeon and dragons- and creating characters.  Positive "Mental strength to deal w/ stuff."    Supports: mom, Dr. Benigno Brakeman at [facility] quest. Madelyne Schiff close friend.     Goal/Needs for Treatment:  In order of importance to patient 1) increase and improve social interaction and communication 2) decrease feelings of emptiness and loneliness 3) ---   Client Statement of Needs: Pt:  "socialize better in general and be better at communicating".  Feels that there is a mental block in front of me. Not feel empty and lonely"  Working towards independence.     Treatment Level:outpatient counseling  Symptoms:difficulty w/ social interaction, feeling empty and lonely  Client Treatment Preferences: continue counseling 1-2 times a month.  Dr. Sheria Dills medication evaluations.     Healthcare consumer's goal for treatment:  Counselor, Clydie Darter, [facility] will support the  patient's ability to achieve the goals identified. Cognitive Behavioral Therapy, Assertive Communication/Conflict Resolution Training, Relaxation Training, ACT, Humanistic and other evidenced-based practices will be used to promote progress towards healthy functioning.   Healthcare consumer will: Actively participate in therapy,  working towards healthy functioning.    *Justification for Continuation/Discontinuation of Goal: R=Revised, O=Ongoing, A=Achieved, D=Discontinued  Goal 1) Increase effective communication skills and positive social interactions AEB pt report and therapist observation. Baseline date 02/06/24: Progress towards goal 0; How Often - Daily Target Date Goal Was reviewed Status Code Progress towards goal/Likert rating  02/05/25                Goal 2) Decrease depressed mood, loneliness and empty feelings AEB pt report and therapist observation. Baseline date 02/06/24: Progress towards goal 0; How Often - Daily Target Date Goal Was reviewed Status Code Progress towards goal  02/05/25                  This plan has been reviewed and created by the following participants:  This plan will be reviewed at least every 12 months. Date Behavioral Health Clinician Date Guardian/Patient   02/06/24 [facility] Murrel Arnt [facility] 02/06/24 Verbal Consent Provided                      Clydie Darter LCM[facility]

## 2024-05-05 ENCOUNTER — Other Ambulatory Visit (HOSPITAL_COMMUNITY): Payer: Self-pay

## 2024-05-06 ENCOUNTER — Other Ambulatory Visit: Payer: Self-pay

## 2024-06-05 ENCOUNTER — Ambulatory Visit (INDEPENDENT_AMBULATORY_CARE_PROVIDER_SITE_OTHER): Admitting: Psychology

## 2024-06-05 DIAGNOSIS — F3341 Major depressive disorder, recurrent, in partial remission: Secondary | ICD-10-CM | POA: Diagnosis not present

## 2024-06-05 DIAGNOSIS — F411 Generalized anxiety disorder: Secondary | ICD-10-CM

## 2024-06-05 DIAGNOSIS — F902 Attention-deficit hyperactivity disorder, combined type: Secondary | ICD-10-CM

## 2024-06-05 NOTE — Progress Notes (Signed)
 [facility] Counselor/Therapist Progress Note  Patient ID: Michelle Blevins, MRN: [id number],    Date: 06/05/2024  Time Spent: 10:03am-10:39am   Treatment Type: Individual Therapy  Pt is seen for a virtual video visit via caregility.  Pt joins from her home, reporting privacy, and counselor from her home office.  Pt consents to virtual visit and is aware of limitations of such visits.  Reported Symptoms: pt reports still anxiety about failing, but reports she is doing well w/ school.     Mental Status Exam: Appearance:  Well Groomed     Behavior: Appropriate  Motor: Normal  Speech/Language:  Normal Rate  Affect: Blunt  Mood: anxious and sad  Thought process: normal  Thought content:   WNL  Sensory/Perceptual disturbances:   WNL  Orientation: oriented to person, place, time/date, and situation  Attention: Good  Concentration: Good  Memory: WNL  Fund of knowledge:  Good  Insight:   Fair  Judgment:  Good  Impulse Control: Good   Risk Assessment: Danger to Self:  No Self-injurious Behavior: No Danger to Others: No Duty to Warn:no Physical Aggression / Violence:No  Access to Firearms a concern: No  Gang Involvement:No   Subjective: counselor assessed pt current functioning per pt report.  Processed w/pt transition  w/ school.  Explored worried thoughts and assisted in challenging and reframing.  Reflected efforts pt is putting in and report of positive outcomes.  Validated and normalized sadness w/ loss of dog.   Discussed memories and ways honoring her dog.   Pt affect blunt.  Pt reports she is in midterms for 4 classes- English, Anatomy, Patient Care and Medical Terminology.  Pt reports she is doing really well and better than expected.  Pt was worried going into classes as had historically struggled academically.  Pt reports she is passing and still has some worries about failing.  Pt is able to acknowledge distortions, challenge and identify reframes and encouraging  self statements.  Pt express sadness w/ having to put her dog down last month. Pt shared about  memories and projects to honor her dog.   Interventions: Cognitive Behavioral Therapy, Solution-Oriented/Positive Psychology, and supportive  Diagnosis:Generalized anxiety disorder  MDD (major depressive disorder), recurrent, in partial remission (HCC)  ADHD (attention deficit hyperactivity disorder), combined type  Plan: Pt to f/u w/ counseling in 4-6 weeks.  Pt to f/u w/ Dr. Conny as scheduled.  Individualized Treatment Plan Strengths: crotchet.  Knitting.  Writing sci fi.   Enjoys Scifi/fantasy watching shows.  Favorite podcast dungeon and dragons- and creating characters.  Positive Mental strength to deal w/ stuff.    Supports: mom, Dr. Cy at [employer]. Karleen close friend.     Goal/Needs for Treatment:  In order of importance to patient 1) increase and improve social interaction and communication 2) decrease feelings of emptiness and loneliness 3) ---   Client Statement of Needs: Pt:  socialize better in general and be better at communicating.  Feels that there is a mental block in front of me. Not feel empty and lonely  Working towards independence.     Treatment Level:outpatient counseling  Symptoms:difficulty w/ social interaction, feeling empty and lonely  Client Treatment Preferences: continue counseling 1-2 times a month.  Dr. Conny medication evaluations.     Healthcare consumer's goal for treatment:  Counselor, Damien Herald, [facility] will support the patient's ability to achieve the goals identified. Cognitive Behavioral Therapy, Assertive Communication/Conflict Resolution Training, Relaxation Training, ACT, Humanistic and other evidenced-based practices will be  used to promote progress towards healthy functioning.   Healthcare consumer will: Actively participate in therapy, working towards healthy functioning.    *Justification for Continuation/Discontinuation of  Goal: R=Revised, O=Ongoing, A=Achieved, D=Discontinued  Goal 1) Increase effective communication skills and positive social interactions AEB pt report and therapist observation. Baseline date 02/06/24: Progress towards goal 0; How Often - Daily Target Date Goal Was reviewed Status Code Progress towards goal/Likert rating  02/05/25                Goal 2) Decrease depressed mood, loneliness and empty feelings AEB pt report and therapist observation. Baseline date 02/06/24: Progress towards goal 0; How Often - Daily Target Date Goal Was reviewed Status Code Progress towards goal  02/05/25                  This plan has been reviewed and created by the following participants:  This plan will be reviewed at least every 12 months. Date Behavioral Health Clinician Date Guardian/Patient   02/06/24 [facility] 02/06/24 Verbal Consent Provided                    BARBARANN APPL [facility]

## 2024-06-18 ENCOUNTER — Ambulatory Visit: Admitting: Psychiatry

## 2024-07-17 ENCOUNTER — Ambulatory Visit: Admitting: Psychology

## 2024-08-08 ENCOUNTER — Other Ambulatory Visit: Payer: Self-pay | Admitting: Psychiatry

## 2024-08-10 ENCOUNTER — Other Ambulatory Visit (HOSPITAL_COMMUNITY): Payer: Self-pay

## 2024-08-10 MED ORDER — BUPROPION HCL ER (XL) 300 MG PO TB24
300.0000 mg | ORAL_TABLET | Freq: Every day | ORAL | 0 refills | Status: AC
  Filled 2024-08-10: qty 30, 30d supply, fill #0

## 2024-09-06 ENCOUNTER — Other Ambulatory Visit: Payer: Self-pay | Admitting: Psychiatry

## 2024-09-10 ENCOUNTER — Other Ambulatory Visit (HOSPITAL_COMMUNITY): Payer: Self-pay

## 2024-09-10 ENCOUNTER — Encounter (HOSPITAL_COMMUNITY): Payer: Self-pay

## 2024-10-29 ENCOUNTER — Other Ambulatory Visit: Payer: Self-pay | Admitting: Psychiatry

## 2025-02-05 ENCOUNTER — Ambulatory Visit: Payer: Self-pay | Admitting: Psychiatry
